# Patient Record
Sex: Female | Born: 1950 | Race: White | Hispanic: No | Smoking: Never smoker
Health system: Southern US, Community
[De-identification: ages and names within clinical notes are randomized; demographics above are authoritative.]

## PROBLEM LIST (undated history)

## (undated) DIAGNOSIS — R350 Frequency of micturition: Secondary | ICD-10-CM

## (undated) DIAGNOSIS — F419 Anxiety disorder, unspecified: Secondary | ICD-10-CM

## (undated) DIAGNOSIS — E785 Hyperlipidemia, unspecified: Secondary | ICD-10-CM

## (undated) DIAGNOSIS — R7302 Impaired glucose tolerance (oral): Secondary | ICD-10-CM

## (undated) DIAGNOSIS — M4800 Spinal stenosis, site unspecified: Secondary | ICD-10-CM

## (undated) DIAGNOSIS — D649 Anemia, unspecified: Secondary | ICD-10-CM

## (undated) DIAGNOSIS — F32A Depression, unspecified: Secondary | ICD-10-CM

## (undated) DIAGNOSIS — I10 Essential (primary) hypertension: Secondary | ICD-10-CM

## (undated) DIAGNOSIS — N390 Urinary tract infection, site not specified: Secondary | ICD-10-CM

## (undated) DIAGNOSIS — E669 Obesity, unspecified: Secondary | ICD-10-CM

## (undated) DIAGNOSIS — L309 Dermatitis, unspecified: Secondary | ICD-10-CM

## (undated) HISTORY — DX: Obesity, unspecified: E66.9

## (undated) HISTORY — PX: TUBAL LIGATION: SHX77

## (undated) HISTORY — DX: Urinary tract infection, site not specified: N39.0

## (undated) HISTORY — DX: Frequency of micturition: R35.0

## (undated) HISTORY — DX: Hyperlipidemia, unspecified: E78.5

## (undated) HISTORY — DX: Spinal stenosis, site unspecified: M48.00

## (undated) HISTORY — DX: Impaired glucose tolerance (oral): R73.02

## (undated) HISTORY — DX: Dermatitis, unspecified: L30.9

## (undated) HISTORY — DX: Essential (primary) hypertension: I10

---

## 1999-01-25 ENCOUNTER — Other Ambulatory Visit: Admission: RE | Admit: 1999-01-25 | Discharge: 1999-01-25 | Payer: Self-pay | Admitting: Family Medicine

## 2001-02-06 ENCOUNTER — Ambulatory Visit (HOSPITAL_COMMUNITY): Admission: RE | Admit: 2001-02-06 | Discharge: 2001-02-06 | Payer: Self-pay | Admitting: *Deleted

## 2001-02-06 ENCOUNTER — Encounter: Payer: Self-pay | Admitting: *Deleted

## 2001-02-12 ENCOUNTER — Other Ambulatory Visit: Admission: RE | Admit: 2001-02-12 | Discharge: 2001-02-12 | Payer: Self-pay | Admitting: *Deleted

## 2001-06-04 HISTORY — PX: OTHER SURGICAL HISTORY: SHX169

## 2002-03-12 ENCOUNTER — Ambulatory Visit (HOSPITAL_COMMUNITY): Admission: RE | Admit: 2002-03-12 | Discharge: 2002-03-12 | Payer: Self-pay | Admitting: *Deleted

## 2002-03-12 ENCOUNTER — Encounter: Payer: Self-pay | Admitting: *Deleted

## 2003-04-08 ENCOUNTER — Ambulatory Visit (HOSPITAL_COMMUNITY): Admission: RE | Admit: 2003-04-08 | Discharge: 2003-04-08 | Payer: Self-pay | Admitting: *Deleted

## 2004-05-12 ENCOUNTER — Ambulatory Visit: Payer: Self-pay | Admitting: Family Medicine

## 2004-07-14 ENCOUNTER — Ambulatory Visit: Payer: Self-pay | Admitting: Family Medicine

## 2004-08-29 ENCOUNTER — Ambulatory Visit: Payer: Self-pay | Admitting: Family Medicine

## 2004-09-12 ENCOUNTER — Ambulatory Visit (HOSPITAL_COMMUNITY): Admission: RE | Admit: 2004-09-12 | Discharge: 2004-09-12 | Payer: Self-pay | Admitting: Family Medicine

## 2004-10-31 ENCOUNTER — Ambulatory Visit: Payer: Self-pay | Admitting: Family Medicine

## 2005-02-23 ENCOUNTER — Ambulatory Visit: Payer: Self-pay | Admitting: Family Medicine

## 2005-03-27 ENCOUNTER — Ambulatory Visit (HOSPITAL_COMMUNITY): Admission: RE | Admit: 2005-03-27 | Discharge: 2005-03-27 | Payer: Self-pay | Admitting: General Surgery

## 2005-06-19 ENCOUNTER — Ambulatory Visit: Payer: Self-pay | Admitting: Family Medicine

## 2005-09-11 ENCOUNTER — Ambulatory Visit: Payer: Self-pay | Admitting: Family Medicine

## 2005-11-08 ENCOUNTER — Ambulatory Visit (HOSPITAL_COMMUNITY): Admission: RE | Admit: 2005-11-08 | Discharge: 2005-11-08 | Payer: Self-pay | Admitting: Family Medicine

## 2005-11-13 ENCOUNTER — Other Ambulatory Visit: Admission: RE | Admit: 2005-11-13 | Discharge: 2005-11-13 | Payer: Self-pay | Admitting: Family Medicine

## 2005-11-13 ENCOUNTER — Ambulatory Visit: Payer: Self-pay | Admitting: Family Medicine

## 2005-11-13 ENCOUNTER — Encounter: Payer: Self-pay | Admitting: Family Medicine

## 2005-11-21 ENCOUNTER — Ambulatory Visit (HOSPITAL_COMMUNITY): Admission: RE | Admit: 2005-11-21 | Discharge: 2005-11-21 | Payer: Self-pay | Admitting: Family Medicine

## 2006-01-15 ENCOUNTER — Ambulatory Visit: Payer: Self-pay | Admitting: Family Medicine

## 2006-07-29 ENCOUNTER — Ambulatory Visit: Payer: Self-pay | Admitting: Family Medicine

## 2006-08-08 ENCOUNTER — Encounter: Payer: Self-pay | Admitting: Family Medicine

## 2006-08-08 LAB — CONVERTED CEMR LAB
ALT: 21 units/L (ref 0–35)
AST: 17 units/L (ref 0–37)
Alkaline Phosphatase: 102 units/L (ref 39–117)
Bilirubin, Direct: 0.1 mg/dL (ref 0.0–0.3)
CO2: 22 meq/L (ref 19–32)
Calcium: 9.5 mg/dL (ref 8.4–10.5)
Chloride: 104 meq/L (ref 96–112)
Cholesterol: 200 mg/dL (ref 0–200)
Creatinine, Ser: 0.94 mg/dL (ref 0.40–1.20)
Glucose, Bld: 107 mg/dL — ABNORMAL HIGH (ref 70–99)
HDL: 38 mg/dL — ABNORMAL LOW (ref >39)
Indirect Bilirubin: 0.5 mg/dL (ref 0.0–0.9)
Total Bilirubin: 0.6 mg/dL (ref 0.3–1.2)
Total Protein: 7.4 g/dL (ref 6.0–8.3)
Triglycerides: 118 mg/dL (ref <150)

## 2006-12-03 ENCOUNTER — Encounter: Payer: Self-pay | Admitting: Family Medicine

## 2006-12-03 ENCOUNTER — Other Ambulatory Visit: Admission: RE | Admit: 2006-12-03 | Discharge: 2006-12-03 | Payer: Self-pay | Admitting: Family Medicine

## 2006-12-03 ENCOUNTER — Ambulatory Visit: Payer: Self-pay | Admitting: Family Medicine

## 2007-02-13 ENCOUNTER — Encounter: Payer: Self-pay | Admitting: Obstetrics and Gynecology

## 2007-02-13 ENCOUNTER — Ambulatory Visit (HOSPITAL_COMMUNITY): Admission: RE | Admit: 2007-02-13 | Discharge: 2007-02-13 | Payer: Self-pay | Admitting: Obstetrics and Gynecology

## 2007-06-05 ENCOUNTER — Encounter: Payer: Self-pay | Admitting: Family Medicine

## 2007-11-18 ENCOUNTER — Ambulatory Visit: Payer: Self-pay | Admitting: Family Medicine

## 2007-11-26 ENCOUNTER — Ambulatory Visit (HOSPITAL_COMMUNITY): Admission: RE | Admit: 2007-11-26 | Discharge: 2007-11-26 | Payer: Self-pay | Admitting: Family Medicine

## 2007-11-27 ENCOUNTER — Encounter: Payer: Self-pay | Admitting: Family Medicine

## 2007-11-27 DIAGNOSIS — E785 Hyperlipidemia, unspecified: Secondary | ICD-10-CM | POA: Insufficient documentation

## 2007-11-27 DIAGNOSIS — L259 Unspecified contact dermatitis, unspecified cause: Secondary | ICD-10-CM | POA: Insufficient documentation

## 2007-11-27 DIAGNOSIS — I10 Essential (primary) hypertension: Secondary | ICD-10-CM | POA: Insufficient documentation

## 2007-11-27 DIAGNOSIS — E669 Obesity, unspecified: Secondary | ICD-10-CM | POA: Insufficient documentation

## 2007-11-29 ENCOUNTER — Emergency Department (HOSPITAL_COMMUNITY): Admission: EM | Admit: 2007-11-29 | Discharge: 2007-11-29 | Payer: Self-pay | Admitting: Emergency Medicine

## 2007-12-16 ENCOUNTER — Encounter: Payer: Self-pay | Admitting: Family Medicine

## 2007-12-29 ENCOUNTER — Other Ambulatory Visit: Admission: RE | Admit: 2007-12-29 | Discharge: 2007-12-29 | Payer: Self-pay | Admitting: Family Medicine

## 2007-12-29 ENCOUNTER — Ambulatory Visit: Payer: Self-pay | Admitting: Family Medicine

## 2007-12-29 ENCOUNTER — Encounter: Payer: Self-pay | Admitting: Family Medicine

## 2007-12-29 LAB — CONVERTED CEMR LAB: Pap Smear: NORMAL

## 2008-01-12 ENCOUNTER — Telehealth: Payer: Self-pay | Admitting: Family Medicine

## 2008-01-13 ENCOUNTER — Ambulatory Visit: Payer: Self-pay | Admitting: Family Medicine

## 2008-01-13 LAB — CONVERTED CEMR LAB
Glucose, Urine, Semiquant: NEGATIVE
Ketones, urine, test strip: NEGATIVE
Specific Gravity, Urine: 1.02
Urobilinogen, UA: 1
pH: 5

## 2008-01-15 ENCOUNTER — Encounter: Payer: Self-pay | Admitting: Family Medicine

## 2008-02-03 ENCOUNTER — Telehealth: Payer: Self-pay | Admitting: Family Medicine

## 2008-05-06 ENCOUNTER — Encounter: Payer: Self-pay | Admitting: Family Medicine

## 2008-07-19 ENCOUNTER — Ambulatory Visit: Payer: Self-pay | Admitting: Family Medicine

## 2008-07-19 LAB — CONVERTED CEMR LAB: Hgb A1c MFr Bld: 6.1 %

## 2008-08-05 ENCOUNTER — Telehealth: Payer: Self-pay | Admitting: Family Medicine

## 2008-08-05 ENCOUNTER — Encounter: Payer: Self-pay | Admitting: Family Medicine

## 2008-12-09 ENCOUNTER — Ambulatory Visit: Payer: Self-pay | Admitting: Family Medicine

## 2008-12-09 DIAGNOSIS — I83893 Varicose veins of bilateral lower extremities with other complications: Secondary | ICD-10-CM | POA: Insufficient documentation

## 2008-12-15 ENCOUNTER — Encounter: Payer: Self-pay | Admitting: Family Medicine

## 2008-12-20 ENCOUNTER — Ambulatory Visit (HOSPITAL_COMMUNITY): Admission: RE | Admit: 2008-12-20 | Discharge: 2008-12-20 | Payer: Self-pay | Admitting: Family Medicine

## 2009-01-31 ENCOUNTER — Ambulatory Visit: Payer: Self-pay | Admitting: Vascular Surgery

## 2009-01-31 ENCOUNTER — Encounter: Payer: Self-pay | Admitting: Family Medicine

## 2009-02-02 ENCOUNTER — Other Ambulatory Visit: Admission: RE | Admit: 2009-02-02 | Discharge: 2009-02-02 | Payer: Self-pay | Admitting: Family Medicine

## 2009-02-02 ENCOUNTER — Encounter: Payer: Self-pay | Admitting: Family Medicine

## 2009-02-02 ENCOUNTER — Ambulatory Visit: Payer: Self-pay | Admitting: Family Medicine

## 2009-02-02 LAB — CONVERTED CEMR LAB: Hgb A1c MFr Bld: 6.1 %

## 2009-02-24 ENCOUNTER — Telehealth: Payer: Self-pay | Admitting: Family Medicine

## 2009-02-25 ENCOUNTER — Ambulatory Visit: Payer: Self-pay | Admitting: Family Medicine

## 2009-02-25 ENCOUNTER — Telehealth: Payer: Self-pay | Admitting: Family Medicine

## 2009-02-25 LAB — CONVERTED CEMR LAB
Glucose, Urine, Semiquant: NEGATIVE
Nitrite: NEGATIVE
WBC Urine, dipstick: NEGATIVE

## 2009-02-26 ENCOUNTER — Encounter: Payer: Self-pay | Admitting: Family Medicine

## 2009-03-10 ENCOUNTER — Telehealth: Payer: Self-pay | Admitting: Family Medicine

## 2009-03-10 DIAGNOSIS — R5381 Other malaise: Secondary | ICD-10-CM | POA: Insufficient documentation

## 2009-03-10 DIAGNOSIS — R5383 Other fatigue: Secondary | ICD-10-CM

## 2009-03-21 ENCOUNTER — Encounter: Payer: Self-pay | Admitting: Family Medicine

## 2009-04-14 ENCOUNTER — Ambulatory Visit: Payer: Self-pay | Admitting: Family Medicine

## 2009-07-07 ENCOUNTER — Ambulatory Visit: Payer: Self-pay | Admitting: Family Medicine

## 2009-08-05 ENCOUNTER — Telehealth: Payer: Self-pay | Admitting: Family Medicine

## 2009-08-10 ENCOUNTER — Encounter: Payer: Self-pay | Admitting: Family Medicine

## 2009-08-16 ENCOUNTER — Ambulatory Visit: Payer: Self-pay | Admitting: Family Medicine

## 2009-08-16 DIAGNOSIS — R7303 Prediabetes: Secondary | ICD-10-CM | POA: Insufficient documentation

## 2009-10-17 ENCOUNTER — Ambulatory Visit: Payer: Self-pay | Admitting: Family Medicine

## 2009-10-19 ENCOUNTER — Telehealth: Payer: Self-pay | Admitting: Physician Assistant

## 2010-01-12 ENCOUNTER — Encounter: Payer: Self-pay | Admitting: Family Medicine

## 2010-01-25 ENCOUNTER — Encounter: Payer: Self-pay | Admitting: Family Medicine

## 2010-03-21 ENCOUNTER — Telehealth: Payer: Self-pay | Admitting: Family Medicine

## 2010-04-05 ENCOUNTER — Other Ambulatory Visit: Admission: RE | Admit: 2010-04-05 | Discharge: 2010-04-05 | Payer: Self-pay | Admitting: Family Medicine

## 2010-04-05 ENCOUNTER — Ambulatory Visit: Payer: Self-pay | Admitting: Family Medicine

## 2010-04-05 LAB — HM PAP SMEAR

## 2010-04-05 LAB — HM DIABETES FOOT EXAM

## 2010-04-05 LAB — CONVERTED CEMR LAB: OCCULT 1: NEGATIVE

## 2010-04-10 ENCOUNTER — Encounter: Payer: Self-pay | Admitting: Family Medicine

## 2010-04-10 LAB — CONVERTED CEMR LAB: Pap Smear: NEGATIVE

## 2010-04-11 ENCOUNTER — Ambulatory Visit (HOSPITAL_COMMUNITY): Admission: RE | Admit: 2010-04-11 | Discharge: 2010-04-11 | Payer: Self-pay | Admitting: Family Medicine

## 2010-04-18 ENCOUNTER — Encounter: Payer: Self-pay | Admitting: Family Medicine

## 2010-06-25 ENCOUNTER — Encounter: Payer: Self-pay | Admitting: Family Medicine

## 2010-06-26 ENCOUNTER — Encounter: Payer: Self-pay | Admitting: Family Medicine

## 2010-07-06 NOTE — Letter (Signed)
Summary: MEDICAL RELEASE  MEDICAL RELEASE   Imported By: Lind Guest 04/05/2010 17:19:22  _____________________________________________________________________  External Attachment:    Type:   Image     Comment:   External Document

## 2010-07-06 NOTE — Letter (Signed)
Summary: medication management summary / [employer]  medication management summary / [employer]   Imported By: Lind Guest 04/18/2010 14:32:43  _____________________________________________________________________  External Attachment:    Type:   Image     Comment:   External Document

## 2010-07-06 NOTE — Assessment & Plan Note (Signed)
Summary: office visit   Vital Signs:  Patient profile:   60 year old female Menstrual status:  postmenopausal Height:      65 inches Weight:      194 pounds BMI:     32.40 O2 Sat:      98 % Pulse rate:   91 / minute Pulse rhythm:   regular Resp:     16 per minute BP sitting:   150 / 92  Vitals Entered By: Everitt Amber (July 07, 2009 9:24 AM)  Nutrition Counseling: Patient's BMI is greater than 25 and therefore counseled on weight management options. CC: hurting in her shoulders and all through her neck, throbbing like a toothache and she can't sleep, neck hurts when she turns it. Been off and on for a few weeks   CC:  hurting in her shoulders and all through her neck, throbbing like a toothache and she can't sleep, and neck hurts when she turns it. Been off and on for a few weeks.  History of Present Illness: Reports  that she has been doing well, except for neck spasm and muscle tightness, which has worsened over the past month. The pt has been very involved in moving over the past 6 to 8 months , and she believes that this is a culmination of the entire process. Turning her head is even painful. Denies recent fever or chills. Denies sinus pressure, nasal congestion , ear pain or sore throat. Denies chest congestion, or cough productive of sputum. Denies chest pain, palpitations, PND, orthopnea or leg swelling. Denies abdominal pain, nausea, vomitting, diarrhea or constipation. Denies change in bowel movements or bloody stool. Denies dysuria , frequency, incontinence or hesitancy.  Denies headaches, vertigo, seizures. Denies depression, anxiety or insomnia. Denies  rash, lesions, or itch.     Current Medications (verified): 1)  Atenolol 25 Mg  Tabs (Atenolol) .... One Tab By Mouth Once Daily 2)  Lisinopril-Hydrochlorothiazide 20-25 Mg  Tabs (Lisinopril-Hydrochlorothiazide) .... One Tab By Mouth Once Daily 3)  Citalopram Hydrobromide 10 Mg Tabs (Citalopram Hydrobromide)  .... Take 1 Tablet By Mouth Once A Day 4)  Trilipix 135 Mg Cpdr (Choline Fenofibrate) .... Take 1 Tablet By Mouth Once A Day  Allergies (verified): No Known Drug Allergies  Review of Systems MS:  Complains of stiffness; neck pain and spa several months, moving her homenew . Heme:  Denies abnormal bruising and bleeding. Allergy:  Denies hives or rash and sneezing.  Physical Exam  General:  Well-developed,well-nourished,in no acute distress; alert,appropriate and cooperative throughout examination HEENT: No facial asymmetry,  EOMI, No sinus tenderness, TM's Clear, oropharynx  pink and moist. decreased ROM cervical spine with bilateral trapezius spsm  Chest: Clear to auscultation bilaterally.  CVS: S1, S2, No murmurs, No S3.   Abd: Soft, Nontender.  MS: Adequate ROM spine, hips, shoulders and knees.  Ext: No edema.   CNS: CN 2-12 intact, power tone and sensation normal throughout.   Skin: Intact, erythematous macular rash on lower extremities  Psych: Good eye contact, normal affect.  Memory intact, not anxious or depressed appearing.    Impression & Recommendations:  Problem # 1:  NECK PAIN (ICD-723.1) Assessment Comment Only  Her updated medication list for this problem includes:    Cyclobenzaprine Hcl 10 Mg Tabs (Cyclobenzaprine hcl) .Marland Kitchen... Take 1 tab by mouth at bedtime  Orders: Depo- Medrol 80mg  (J1040) Ketorolac-Toradol 15mg  (U0454) Admin of Therapeutic Inj  intramuscular or subcutaneous (09811)  Problem # 2:  SHOULDER PAIN, LEFT (ICD-719.41)  Assessment: Deteriorated  Her updated medication list for this problem includes:    Cyclobenzaprine Hcl 10 Mg Tabs (Cyclobenzaprine hcl) .Marland Kitchen... Take 1 tab by mouth at bedtime  Problem # 3:  HYPERTENSION (ICD-401.9) Assessment: Deteriorated  Her updated medication list for this problem includes:    Atenolol 25 Mg Tabs (Atenolol) ..... One tab by mouth once daily    Lisinopril-hydrochlorothiazide 20-25 Mg Tabs  (Lisinopril-hydrochlorothiazide) ..... One tab by mouth once daily  BP today: 150/92 Prior BP: 123/75 (04/14/2009)  Labs Reviewed: K+: 4.5 (08/08/2006) Creat: : 0.94 (08/08/2006)   Chol: 200 (08/08/2006)   HDL: 38 (08/08/2006)   LDL: 138 (08/08/2006)   TG: 118 (08/08/2006)  Problem # 4:  HYPERLIPIDEMIA (ICD-272.4) Assessment: Comment Only  The following medications were removed from the medication list:    Trilipix 135 Mg Cpdr (Choline fenofibrate) ..... One tab by mouth qd Her updated medication list for this problem includes:    Trilipix 135 Mg Cpdr (Choline fenofibrate) .Marland Kitchen... Take 1 tablet by mouth once a day  Labs Reviewed: SGOT: 17 (08/08/2006)   SGPT: 21 (08/08/2006)   HDL:38 (08/08/2006)  LDL:138 (08/08/2006)  Chol:200 (08/08/2006)  Trig:118 (08/08/2006), nmore recent labs done on the job  Problem # 5:  OBESITY (ICD-278.00) Assessment: Unchanged  Ht: 65 (07/07/2009)   Wt: 194 (07/07/2009)   BMI: 32.40 (07/07/2009)  Complete Medication List: 1)  Atenolol 25 Mg Tabs (Atenolol) .... One tab by mouth once daily 2)  Lisinopril-hydrochlorothiazide 20-25 Mg Tabs (Lisinopril-hydrochlorothiazide) .... One tab by mouth once daily 3)  Citalopram Hydrobromide 10 Mg Tabs (Citalopram hydrobromide) .... Take 1 tablet by mouth once a day 4)  Trilipix 135 Mg Cpdr (Choline fenofibrate) .... Take 1 tablet by mouth once a day 5)  Cyclobenzaprine Hcl 10 Mg Tabs (Cyclobenzaprine hcl) .... Take 1 tab by mouth at bedtime 6)  Voltaren 1 % Gel (Diclofenac sodium) .... Apply 3 times daily to affected area  Patient Instructions: 1)  f/u as before. 2)    3)  It is important that you exercise regularly at least 20 minutes 5 times a week. If you develop chest pain, have severe difficulty breathing, or feel very tired , stop exercising immediately and seek medical attention. 4)  You need to lose weight. Consider a lower calorie diet and regular exercise.  5)  You will get injections in the offie today  for the neck pain and spasm  and meds are sent in also. Prescriptions: VOLTAREN 1 % GEL (DICLOFENAC SODIUM) apply 3 times daily to affected area  #30gm x 1   Entered and Authorized by:   Syliva Overman MD   Signed by:   Syliva Overman MD on 07/07/2009   Method used:   Electronically to        [facility] (retail)       [address]       [place], [place]  [address]       Ph: [phone]       Fax: [phone]   RxID:   [id number] CYCLOBENZAPRINE HCL 10 MG TABS (CYCLOBENZAPRINE HCL) Take 1 tab by mouth at bedtime  #30 x 0   Entered and Authorized by:   Syliva Overman MD   Signed by:   Syliva Overman MD on 07/07/2009   Method used:   Electronically to        [facility] (retail)       [phone] Shelbie Hutching  [address]       [place], [place]  [address]       Ph: [phone]       Fax: [phone]   RxID:   [id number] IBUPROFEN 800 MG TABS (IBUPROFEN) Take 1 tablet by mouth three times a day  #30 x 0   Entered and Authorized by:   Syliva Overman MD   Signed by:   Syliva Overman MD on 07/07/2009   Method used:   Electronically to        [facility] (retail)       [address]       [place], [place]  [address]       Ph: [phone]       Fax: [phone]   RxID:   [id number] PREDNISONE (PAK) 5 MG TABS (PREDNISONE) Use as directed  #21 x 0   Entered and Authorized by:   Syliva Overman MD   Signed by:   Syliva Overman MD on 07/07/2009   Method used:   Electronically to        [facility] (retail)       [address]       [place], [place]  [address]       Ph: [phone]       Fax: [phone]   RxID:   [id number]    Medication Administration  Injection # 1:    Medication: Depo- Medrol 80mg     Diagnosis: NECK PAIN (ICD-723.1)    Route: IM    Site: RUOQ gluteus    Exp Date: 03/2010    Lot #: [id number]    Mfr: [employer]     Comments: 80mg  given     Patient tolerated injection without complications    Given by: Everitt Amber (July 07, 2009 10:21 AM)  Injection # 2:    Medication: Ketorolac-Toradol 15mg     Diagnosis: NECK PAIN (ICD-723.1)    Route: IM    Site: LUOQ gluteus    Exp Date: 02/2011    Lot #: [id number]    Mfr: hospira  Orders Added: 1)  Est. Patient Level IV [09381] 2)  Depo- Medrol 80mg  [J1040] 3)  Ketorolac-Toradol 15mg  [J1885] 4)  Admin of Therapeutic Inj  intramuscular or subcutaneous [82993]

## 2010-07-06 NOTE — Progress Notes (Signed)
Summary: lab work  Phone Note Call from Patient   Summary of Call: has appt. 3.15.11  does she need lab work done or what call her back at [phone] ext. [phone] if no answer leave message Initial call taken by: Lind Guest,  August 05, 2009 9:32 AM  Follow-up for Phone Call        labs printed and faxed to her. Patient aware Follow-up by: Everitt Amber LPN,  August 05, 2009 9:53 AM

## 2010-07-06 NOTE — Letter (Signed)
Summary: misc.  misc.   Imported By: Curtis Sites 10/21/2009 11:11:27  _____________________________________________________________________  External Attachment:    Type:   Image     Comment:   External Document

## 2010-07-06 NOTE — Letter (Signed)
Summary: progress notes  progress notes   Imported By: Curtis Sites 10/21/2009 10:59:53  _____________________________________________________________________  External Attachment:    Type:   Image     Comment:   External Document

## 2010-07-06 NOTE — Letter (Signed)
Summary: consults  consults   Imported By: Curtis Sites 10/21/2009 11:09:52  _____________________________________________________________________  External Attachment:    Type:   Image     Comment:   External Document

## 2010-07-06 NOTE — Assessment & Plan Note (Signed)
Summary: FOLLOW UP   Vital Signs:  Patient profile:   60 year old female Menstrual status:  postmenopausal Height:      65 inches Weight:      192.50 pounds BMI:     32.15 O2 Sat:      98 % Pulse rate:   66 / minute Pulse rhythm:   regular Resp:     16 per minute BP sitting:   130 / 80  (left arm) Cuff size:   large  Vitals Entered By: Everitt Amber LPN (August 16, 2009 8:16 AM)  Nutrition Counseling: Patient's BMI is greater than 25 and therefore counseled on weight management options. CC: Follow up chronic problems   Primary Care Provider:  Syliva Overman MD  CC:  Follow up chronic problems.  History of Present Illness: pt reports improvement in her right shoulderas far asa pain and reducedmobility are concerned , about 90% better. she is here to review recent labs which show uncontrolled hyperlipidemia as well as a new dx of diabetes. Her mother and 2 siblings are diabetic.She is currently asymptomatic, it is true that hse recently had steroids for the shoulder which may have tipped her, i diod stayte this and encouraged her strongly to embrace lifestyle changes which may actually negat the need for meds over time.  Denies recent fever or chills. Denies sinus pressure, nasal congestion , ear pain or sore throat. Denies chest congestion, or cough productive of sputum. Denies chest pain, palpitations, PND, orthopnea or leg swelling. Denies abdominal pain, nausea, vomitting, diarrhea or constipation. Denies change in bowel movements or bloody stool. Denies dysuria , frequency, incontinence or hesitancy. Denies  joint pain, swelling, or reduced mobility. Denies headaches, vertigo, seizures. Denies depression, anxiety or insomnia. Reports improvement in rash on legs with the use of monistat   Current Medications (verified): 1)  Atenolol 25 Mg  Tabs (Atenolol) .... One Tab By Mouth Once Daily 2)  Lisinopril-Hydrochlorothiazide 20-25 Mg  Tabs (Lisinopril-Hydrochlorothiazide)  .... One Tab By Mouth Once Daily 3)  Citalopram Hydrobromide 10 Mg Tabs (Citalopram Hydrobromide) .... Take 1 Tablet By Mouth Once A Day 4)  Trilipix 135 Mg Cpdr (Choline Fenofibrate) .... Take 1 Tablet By Mouth Once A Day 5)  Cyclobenzaprine Hcl 10 Mg Tabs (Cyclobenzaprine Hcl) .... Take 1 Tab By Mouth At Bedtime 6)  Voltaren 1 % Gel (Diclofenac Sodium) .... Apply 3 Times Daily To Affected Area  Allergies (verified): No Known Drug Allergies  Past History:  Past medical, surgical, family and social histories (including risk factors) reviewed, and no changes noted (except as noted below).  Past Medical History: Reviewed history from 11/27/2007 and no changes required. Current Problems:  IMPAIRED GLUCOSE TOLERANCE (ICD-271.3) DERMATITIS (ICD-692.9) OBESITY (ICD-278.00) HYPERLIPIDEMIA (ICD-272.4) HYPERTENSION (ICD-401.9)  Past Surgical History: Reviewed history from 11/27/2007 and no changes required. Right breast bx. calcification benign (2003)  Family History: Reviewed history from 11/27/2007 and no changes required. Mother living - Addison dz, diabetic Father deceased 32 - stroke and heart attack Two living sisters - x1 irregular heart rate, x1 hx of bacterial , diabetesx 2endocarditis or rheumatic fever Two living brothers - x1 HTN, CAD Twin brothers died at birth  Social History: Reviewed history from 11/27/2007 and no changes required. Employed widow One son Never Smoked Alcohol use-no Drug use-no  Review of Systems      See HPI Eyes:  Denies blurring, discharge, eye pain, and red eye; has had eye exam in the past 6 months, reportedly normal. MS:  Complains  of joint pain and stiffness; impropved right shoulder pain and mobility. Endo:  Denies cold intolerance, excessive hunger, excessive thirst, excessive urination, heat intolerance, polyuria, and weight change. Heme:  Denies abnormal bruising and bleeding. Allergy:  Denies hives or rash and itching  eyes.  Physical Exam  General:  Well-developed,well-nourished,in no acute distress; alert,appropriate and cooperative throughout examination HEENT: No facial asymmetry,  EOMI, No sinus tenderness, TM's Clear, oropharynx  pink and moist.   Chest: Clear to auscultation bilaterally.  CVS: S1, S2, No murmurs, No S3.   Abd: Soft, Nontender.  MS: Adequate ROM spine, hips, shoulders and knees.  Ext: No edema.   CNS: CN 2-12 intact, power tone and sensation normal throughout.   Skin: Intact, no visible lesions or rashes.  Psych: Good eye contact, normal affect.  Memory intact, not anxious or depressed appearing.   Diabetes Management Exam:    Foot Exam (with socks and/or shoes not present):       Sensory-Monofilament:          Left foot: normal          Right foot: normal       Inspection:          Left foot: normal          Right foot: normal       Nails:          Left foot: normal          Right foot: normal   Impression & Recommendations:  Problem # 1:  DIABETES MELLITUS (ICD-250.00) Assessment Comment Only  Her updated medication list for this problem includes:    Lisinopril-hydrochlorothiazide 20-25 Mg Tabs (Lisinopril-hydrochlorothiazide) ..... One tab by mouth once daily    Metformin Hcl 500 Mg Tabs (Metformin hcl) .Marland Kitchen... Take 1 tablet by mouth two times a day  Orders: T- Hemoglobin A1C ([id number])  6.5 08/2009, diabetic ed done by mD and nurse in the office and community resources provided also  Labs Reviewed: Creat: 0.94 (08/08/2006)    Reviewed HgBA1c results: 6.1 (02/02/2009)  6.1 (07/19/2008)  Problem # 2:  SHOULDER PAIN, LEFT (ICD-719.41) Assessment: Improved  Her updated medication list for this problem includes:    Cyclobenzaprine Hcl 10 Mg Tabs (Cyclobenzaprine hcl) .Marland Kitchen... Take 1 tab by mouth at bedtime  Problem # 3:  NECK PAIN (ICD-723.1) Assessment: Improved  Her updated medication list for this problem includes:    Cyclobenzaprine Hcl 10 Mg Tabs  (Cyclobenzaprine hcl) .Marland Kitchen... Take 1 tab by mouth at bedtime  Problem # 4:  HYPERLIPIDEMIA (ICD-272.4) Assessment: Deteriorated  The following medications were removed from the medication list:    Trilipix 135 Mg Cpdr (Choline fenofibrate) .Marland Kitchen... Take 1 tablet by mouth once a day Her updated medication list for this problem includes:    Lovastatin 20 Mg Tabs (Lovastatin) .Marland Kitchen... Take 1 tab by mouth at bedtime  Orders: T-Hepatic Function [id number]), 2011 labs reviewed lDL elevated, pt to resume lovastatin 20mg  and follow low fat diet which was discussed T-Lipid Profile [phone])  Labs Reviewed: SGOT: 17 (08/08/2006)   SGPT: 21 (08/08/2006)   HDL:38 (08/08/2006)  LDL:138 (08/08/2006)  Chol:200 (08/08/2006)  Trig:118 (08/08/2006)  Problem # 5:  HYPERTENSION (ICD-401.9) Assessment: Improved  Her updated medication list for this problem includes:    Atenolol 25 Mg Tabs (Atenolol) ..... One tab by mouth once daily    Lisinopril-hydrochlorothiazide 20-25 Mg Tabs (Lisinopril-hydrochlorothiazide) ..... One tab by mouth once daily  Orders: T-Basic Metabolic Panel [id number])  BP today: 130/80  Prior BP: 150/92 (07/07/2009)  Labs Reviewed: K+: 4.5 (08/08/2006) Creat: : 0.94 (08/08/2006)   Chol: 200 (08/08/2006)   HDL: 38 (08/08/2006)   LDL: 138 (08/08/2006)   TG: 118 (08/08/2006)  Complete Medication List: 1)  Atenolol 25 Mg Tabs (Atenolol) .... One tab by mouth once daily 2)  Lisinopril-hydrochlorothiazide 20-25 Mg Tabs (Lisinopril-hydrochlorothiazide) .... One tab by mouth once daily 3)  Citalopram Hydrobromide 10 Mg Tabs (Citalopram hydrobromide) .... Take 1 tablet by mouth once a day 4)  Cyclobenzaprine Hcl 10 Mg Tabs (Cyclobenzaprine hcl) .... Take 1 tab by mouth at bedtime 5)  Voltaren 1 % Gel (Diclofenac sodium) .... Apply 3 times daily to affected area 6)  Metformin Hcl 500 Mg Tabs (Metformin hcl) .... Take 1 tablet by mouth two times a day 7)  Lovastatin 20 Mg Tabs  (Lovastatin) .... Take 1 tab by mouth at bedtime  Patient Instructions: 1)  Please schedule a follow-up appointment in 3 months. 2)  It is important that you exercise regularly at least 30 minutes 6 times a week. If you develop chest pain, have severe difficulty breathing, or feel very tired , stop exercising immediately and seek medical attention. 3)  You need to lose weight. Consider a lower calorie diet and regular exercise.  4)  Check your blood sugars regularly. If your readings are usually above 180 or below 70 you should contact our office. 5)  Pls start metformin 500mg  one daily, after 2 to 3 weeks  if blood sugars are still out of range inc to twice daily, call with questions. 6)  Pls drink at least 42 ounces of water daily. 7)  Pls speak to a diabetic educator asap. 8)  Pls tes once daily and record, ok to test at different times of the day. 9)  Pls resume lovastatin one at night and follow a low fat diet your bad cholest is high. 10)  BMP prior to visit, ICD-9: 11)  Hepatic Panel prior to visit, ICD-9:  fasting in 3 months 12)  Lipid Panel prior to visit, ICD-9: 13)  HbgA1C prior to visit, ICD-9: Prescriptions: LOVASTATIN 20 MG TABS (LOVASTATIN) Take 1 tab by mouth at bedtime  #90 x 0   Entered and Authorized by:   Syliva Overman MD   Signed by:   Syliva Overman MD on 08/16/2009   Method used:   Electronically to        [facility] (retail)       [address]       [place], [place]  [address]       Ph: [phone]       Fax: [phone]   RxID:   [id number] METFORMIN HCL 500 MG TABS (METFORMIN HCL) Take 1 tablet by mouth two times a day  #180 x 0   Entered and Authorized by:   Syliva Overman MD   Signed by:   Syliva Overman MD on 08/16/2009   Method used:   Electronically to        [facility] (retail)       [address]       [place], [place]  [address]       Ph: [phone]       Fax:  [phone]   RxID:   [id number]

## 2010-07-06 NOTE — Letter (Signed)
Summary: xray  xray   Imported By: Curtis Sites 10/21/2009 11:10:37  _____________________________________________________________________  External Attachment:    Type:   Image     Comment:   External Document

## 2010-07-06 NOTE — Medication Information (Signed)
Summary: Tax adviser   Imported By: Lind Guest 04/05/2010 17:19:01  _____________________________________________________________________  External Attachment:    Type:   Image     Comment:   External Document

## 2010-07-06 NOTE — Letter (Signed)
Summary: Pap Smear, Normal Letter, [facility]  [address]   [place], [place] [address]   Phone: [phone]  Fax: [phone]          April 10, 2010    Dear: [name]    I am pleased to notify you that your PAP smear was normal.  You will need your next PAP smear in:     ____ 3 Months    ____ 6 Months    __*__12 Months    Please call the office at our office number above, to schedule your next appointment.    Sincerely,    Marijean Niemann B. Boothe, LPN [facility]

## 2010-07-06 NOTE — Letter (Signed)
Summary: labs  labs   Imported By: Curtis Sites 10/21/2009 10:59:23  _____________________________________________________________________  External Attachment:    Type:   Image     Comment:   External Document

## 2010-07-06 NOTE — Assessment & Plan Note (Signed)
Summary: sick- room 1   Vital Signs:  Patient profile:   60 year old female Menstrual status:  postmenopausal Height:      65 inches Weight:      190.75 pounds BMI:     31.86 O2 Sat:      96 % on Room air Pulse rate:   88 / minute Resp:     16 per minute BP sitting:   126 / 70  (left arm)  Vitals Entered By: Adella Hare LPN (Oct 17, 2009 9:23 AM) CC: cough, congestion, chills Is Patient Diabetic? No Pain Assessment Patient in pain? no      Comments didnt bring meds to ov   Primary Provider:  Syliva Overman MD  CC:  cough, congestion, and chills.  History of Present Illness: Pt is here today with c/o chest congestion & cough.  The cough started approx 5 days ago.  Her chest feels congested but the cough is nonproductive.   She had nasal congestion for a few days before the onset of the cough.  It went away but now has come back again.  Is clear in color.  No fever or chills.  She has tried over the counter cough & cold medicine without much help.  She is also taking a daily allergy med.  Pt is diabetic.  She checks her blood sugars daily, and states "they are in the ranges that Dr Lodema Hong gave me."  She has an appt next mos for her regular f/u.  She has been walking daily for exercise.   Allergies (verified): No Known Drug Allergies  Past History:  Past medical history reviewed for relevance to current acute and chronic problems.  Past Medical History: Reviewed history from 11/27/2007 and no changes required. Current Problems:  IMPAIRED GLUCOSE TOLERANCE (ICD-271.3) DERMATITIS (ICD-692.9) OBESITY (ICD-278.00) HYPERLIPIDEMIA (ICD-272.4) HYPERTENSION (ICD-401.9)  Review of Systems General:  Denies chills and fever. ENT:  Complains of nasal congestion; denies earache, sinus pressure, and sore throat. CV:  Denies chest pain or discomfort. Resp:  Complains of cough; denies shortness of breath and sputum productive. Allergy:  Complains of seasonal allergies;  denies itching eyes and sneezing.  Physical Exam  General:  Well-developed,well-nourished,in no acute distress; alert,appropriate and cooperative throughout examination Head:  Normocephalic and atraumatic without obvious abnormalities. No apparent alopecia or balding. Ears:  External ear exam shows no significant lesions or deformities.  Otoscopic examination reveals clear canals, tympanic membranes are intact bilaterally without bulging, retraction, inflammation or discharge. Hearing is grossly normal bilaterally. Nose:  External nasal examination shows no deformity or inflammation. Nasal mucosa are erythematous and moist without lesions or exudates or swelling. no sinus percussion tenderness.   Mouth:  Oral mucosa and oropharynx without lesions or exudates.   Neck:  No deformities, masses, or tenderness noted. Lungs:  Normal respiratory effort, chest expands symmetrically. coarse BS bilat, no crackles or wheezes. Heart:  Normal rate and regular rhythm. S1 and S2 normal without gallop, murmur, click, rub or other extra sounds. Cervical Nodes:  No lymphadenopathy noted Psych:  Cognition and judgment appear intact. Alert and cooperative with normal attention span and concentration. No apparent delusions, illusions, hallucinations   Impression & Recommendations:  Problem # 1:  ACUTE BRONCHITIS (ICD-466.0) Assessment New  Her updated medication list for this problem includes:    Sulfamethoxazole-tmp Ds 800-160 Mg Tabs (Sulfamethoxazole-trimethoprim) .Marland Kitchen... Take 1 two times a day for 10 days    Benzonatate 100 Mg Caps (Benzonatate) .Marland Kitchen... Take 1-2 caps every 8 hrs  as needed for cough  Problem # 2:  URI (ICD-465.9) Assessment: New  Her updated medication list for this problem includes:    Benzonatate 100 Mg Caps (Benzonatate) .Marland Kitchen... Take 1-2 caps every 8 hrs as needed for cough  Orders: Depo- Medrol 80mg  (J1040) Admin of Therapeutic Inj  intramuscular or subcutaneous ([address])  Problem # 3:   DIABETES MELLITUS (ICD-250.00) Assessment: Comment Only Pt will continue to check her blood sugars and keep her routine appt next mos.  Her updated medication list for this problem includes:    Lisinopril-hydrochlorothiazide 20-25 Mg Tabs (Lisinopril-hydrochlorothiazide) ..... One tab by mouth once daily    Metformin Hcl 500 Mg Tabs (Metformin hcl) .Marland Kitchen... Take 1 tablet by mouth two times a day  Complete Medication List: 1)  Atenolol 25 Mg Tabs (Atenolol) .... One tab by mouth once daily 2)  Lisinopril-hydrochlorothiazide 20-25 Mg Tabs (Lisinopril-hydrochlorothiazide) .... One tab by mouth once daily 3)  Citalopram Hydrobromide 10 Mg Tabs (Citalopram hydrobromide) .... Take 1 tablet by mouth once a day 4)  Cyclobenzaprine Hcl 10 Mg Tabs (Cyclobenzaprine hcl) .... Take 1 tab by mouth at bedtime 5)  Voltaren 1 % Gel (Diclofenac sodium) .... Apply 3 times daily to affected area 6)  Metformin Hcl 500 Mg Tabs (Metformin hcl) .... Take 1 tablet by mouth two times a day 7)  Lovastatin 20 Mg Tabs (Lovastatin) .... Take 1 tab by mouth at bedtime 8)  Sulfamethoxazole-tmp Ds 800-160 Mg Tabs (Sulfamethoxazole-trimethoprim) .... Take 1 two times a day for 10 days 9)  Benzonatate 100 Mg Caps (Benzonatate) .... Take 1-2 caps every 8 hrs as needed for cough  Patient Instructions: 1)  Keep your next appt with Dr Lodema Hong 2)  You have received a shot of Depo Medrol today.  You may notice an increase in your blood sugars. 3)  I have prescribed an antibiotic and cough medicine for you. 4)  Get plenty of rest, drink lots of clear liquids, and use Tylenol or Ibuprofen for fever and comfort. Return in 7-10 days if you're not better:sooner if you're feeling worse. Prescriptions: BENZONATATE 100 MG CAPS (BENZONATATE) take 1-2 caps every 8 hrs as needed for cough  #30 x 0   Entered and Authorized by:   Esperanza Sheets PA   Signed by:   Esperanza Sheets PA on 10/17/2009   Method used:   Electronically to        [facility] (retail)       [address]       [place], [place]  [address]       Ph: [phone]       Fax: [phone]   RxID:   [id number] SULFAMETHOXAZOLE-TMP DS 800-160 MG TABS (SULFAMETHOXAZOLE-TRIMETHOPRIM) take 1 two times a day for 10 days  #20 x 0   Entered and Authorized by:   Esperanza Sheets PA   Signed by:   Esperanza Sheets PA on 10/17/2009   Method used:   Electronically to        [facility] [address] (retail)       [address]       [place], [place]  [address]       Ph: [phone]       Fax: [phone]   RxID:   [id number]    Medication Administration  Injection # 1:    Medication: Depo- Medrol 80mg   Diagnosis: URI (ICD-465.9)    Route: IM    Site: LUOQ gluteus    Exp Date: 12/11    Lot #: [id number]    Mfr: [employer]    Patient tolerated injection without complications    Given by: Adella Hare LPN (Oct 17, 2009 10:20 AM)  Orders Added: 1)  Est. Patient Level IV [81191] 2)  Depo- Medrol 80mg  [J1040] 3)  Admin of Therapeutic Inj  intramuscular or subcutaneous [47829]

## 2010-07-06 NOTE — Letter (Signed)
Summary: Letter  Letter   Imported By: Lind Guest 01/25/2010 11:19:56  _____________________________________________________________________  External Attachment:    Type:   Image     Comment:   External Document

## 2010-07-06 NOTE — Letter (Signed)
Summary: phone notes  phone notes   Imported By: Curtis Sites 10/21/2009 11:10:11  _____________________________________________________________________  External Attachment:    Type:   Image     Comment:   External Document

## 2010-07-06 NOTE — Letter (Signed)
Summary: history and physical  history and physical   Imported By: Curtis Sites 10/21/2009 10:59:08  _____________________________________________________________________  External Attachment:    Type:   Image     Comment:   External Document

## 2010-07-06 NOTE — Progress Notes (Signed)
Summary: antibotics  Phone Note Call from Patient   Summary of Call: pt was in this week and got septra and its making her sick.  Needs to know what to do. [phone]  ext [phone] Initial call taken by: Rudene Anda,  Oct 19, 2009 2:38 PM  Follow-up for Phone Call        Advise pt to discontinue Septra & put this in her allergies pls. ERx ZPak #1 no rf for her. Follow-up by: Esperanza Sheets PA,  Oct 19, 2009 3:37 PM  Additional Follow-up for Phone Call Additional follow up Details #1::        rx sent, patient aware Additional Follow-up by: Adella Hare LPN,  Oct 19, 2009 4:40 PM   New Allergies: ! SULFA New/Updated Medications: ZITHROMAX Z-PAK 250 MG TABS (AZITHROMYCIN) use as directed New Allergies: ! SULFAPrescriptions: ZITHROMAX Z-PAK 250 MG TABS (AZITHROMYCIN) use as directed  #1 x 0   Entered by:   Adella Hare LPN   Authorized by:   Esperanza Sheets PA   Signed by:   Adella Hare LPN on 13/01/6577   Method used:   Electronically to        [facility] [address] (retail)       [address]       [place], [place]  [address]       Ph: [phone]       Fax: [phone]   RxID:   [id number]

## 2010-07-06 NOTE — Letter (Signed)
Summary: demographic  demographic   Imported By: Curtis Sites 10/21/2009 10:58:29  _____________________________________________________________________  External Attachment:    Type:   Image     Comment:   External Document

## 2010-07-06 NOTE — Assessment & Plan Note (Signed)
Summary: PHY   Vital Signs:  Patient profile:   60 year old female Menstrual status:  postmenopausal Height:      65 inches Weight:      194.25 pounds BMI:     32.44 O2 Sat:      93 % on Room air Pulse rate:   79 / minute Pulse rhythm:   regular Resp:     16 per minute BP sitting:   118 / 70  (left arm)  Vitals Entered By: Mauricia Area CMA (April 05, 2010 1:27 PM)  Nutrition Counseling: Patient's BMI is greater than 25 and therefore counseled on weight management options.  O2 Flow:  Room air CC: physical  Vision Screening:Left eye with correction: 20 / 13 Right eye with correction: 20 / 20 Both eyes with correction: 20 / 15        Vision Entered By: Mauricia Area CMA (April 05, 2010 1:41 PM)   Primary Care Provider:  Syliva Overman MD  CC:  physical.  History of Present Illness: Reports  that she has been doing fairly well. She has been under increased stress recently becaUSE OF PERSONAL PROBLEMS THAT HER SON HAS BEEN HAVING HOWEVER. Denies recent fever or chills. Denies sinus pressure, nasal congestion , ear pain or sore throat. Denies chest congestion, or cough productive of sputum. Denies chest pain, palpitations, PND, orthopnea or leg swelling. Denies abdominal pain, nausea, vomitting, diarrhea or constipation. Denies change in bowel movements or bloody stool. Denies dysuria , frequency, incontinence or hesitancy. Denies  joint pain, swelling, or reduced mobility. Denies headaches, vertigo, seizures. Denies depression, anxiety or insomnia.      Current Medications (verified): 1)  Atenolol 25 Mg  Tabs (Atenolol) .... One Tab By Mouth Once Daily 2)  Lisinopril-Hydrochlorothiazide 20-25 Mg  Tabs (Lisinopril-Hydrochlorothiazide) .... One Tab By Mouth Once Daily 3)  Citalopram Hydrobromide 10 Mg Tabs (Citalopram Hydrobromide) .... Take 1 Tablet By Mouth Once A Day 4)  Metformin Hcl 500 Mg Tabs (Metformin Hcl) .... Take 1 Tablet By Mouth Two Times A  Day 5)  Lovastatin 20 Mg Tabs (Lovastatin) .... Take 1 Tab By Mouth At Bedtime 6)  Accu-Chek Compact Test Drum  Strp (Glucose Blood) .... Once Daily Testing Dx:250.00  Allergies (verified): 1)  ! Sulfa  Review of Systems      See HPI General:  Complains of fatigue. Eyes:  eye exam nl in 2011. Derm:  Complains of lesion(s) and rash; red rash on left leg x 3 weeks. Endo:  Denies cold intolerance, excessive hunger, excessive thirst, excessive urination, and heat intolerance. Heme:  Denies abnormal bruising and bleeding. Allergy:  Complains of seasonal allergies; denies hives or rash and itching eyes.  Physical Exam  General:  Well-developed,well-nourished,in no acute distress; alert,appropriate and cooperative throughout examination Head:  Normocephalic and atraumatic without obvious abnormalities. No apparent alopecia or balding. Eyes:  No corneal or conjunctival inflammation noted. EOMI. Perrla. Funduscopic exam benign, without hemorrhages, exudates or papilledema. Vision grossly normal. Ears:  External ear exam shows no significant lesions or deformities.  Otoscopic examination reveals clear canals, tympanic membranes are intact bilaterally without bulging, retraction, inflammation or discharge. Hearing is grossly normal bilaterally. Nose:  External nasal examination shows no deformity or inflammation. Nasal mucosa are pink and moist without lesions or exudates. Mouth:  Oral mucosa and oropharynx without lesions or exudates.  Teeth in good repair. Neck:  No deformities, masses, or tenderness noted. Chest Wall:  No deformities, masses, or tenderness noted. Breasts:  No mass, nodules, thickening, tenderness, bulging, retraction, inflamation, nipple discharge or skin changes noted.   Lungs:  Normal respiratory effort, chest expands symmetrically. Lungs are clear to auscultation, no crackles or wheezes. Heart:  Normal rate and regular rhythm. S1 and S2 normal without gallop, murmur, click,  rub or other extra sounds. Abdomen:  Bowel sounds positive,abdomen soft and non-tender without masses, organomegaly or hernias noted. Rectal:  No external abnormalities noted. Normal sphincter tone. No rectal masses or tenderness. Genitalia:  Normal introitus for age, no external lesions, no vaginal discharge, mucosa pink and moist, no vaginal or cervical lesions, no vaginal atrophy, no friaility or hemorrhage, normal uterus size and position, no adnexal masses or tenderness Msk:  No deformity or scoliosis noted of thoracic or lumbar spine.   Pulses:  R and L carotid,radial,femoral,dorsalis pedis and posterior tibial pulses are full and equal bilaterally Neurologic:  No cranial nerve deficits noted. Station and gait are normal. Plantar reflexes are down-going bilaterally. DTRs are symmetrical throughout. Sensory, motor and coordinative functions appear intact. Skin:  erythematous maculopapular raqsh on anterior aspect of left leg Cervical Nodes:  No lymphadenopathy noted Axillary Nodes:  No palpable lymphadenopathy Inguinal Nodes:  No significant adenopathy Psych:  Cognition and judgment appear intact. Alert and cooperative with normal attention span and concentration. No apparent delusions, illusions, hallucinations  Diabetes Management Exam:    Foot Exam (with socks and/or shoes not present):       Sensory-Monofilament:          Left foot: normal          Right foot: normal       Inspection:          Left foot: normal          Right foot: normal       Nails:          Left foot: normal          Right foot: normal    Foot Exam by Podiatrist:       Results: no diabetic findings    Eye Exam:       Eye Exam done elsewhere          Results: normal          Done by: eye specialist   Impression & Recommendations:  Problem # 1:  DIABETES MELLITUS (ICD-250.00) Assessment Unchanged  Labs Reviewed: Creat: 0.94 (08/08/2006)    Reviewed HgBA1c results: 6.1 (02/02/2009)  6.1  (07/19/2008)  Problem # 2:  OBESITY (ICD-278.00) Assessment: Unchanged  Ht: 65 (04/05/2010)   Wt: 194.25 (04/05/2010)   BMI: 32.44 (04/05/2010) therapeutic lifestyle change discussed and encouraged  Problem # 3:  DERMATITIS (ICD-692.9) Assessment: Deteriorated  Her updated medication list for this problem includes:    Prednisone (pak) 5 Mg Tabs (Prednisone) ..... Use as directed  Problem # 4:  HYPERLIPIDEMIA (ICD-272.4) Assessment: Comment Only  Her updated medication list for this problem includes:    Lovastatin 20 Mg Tabs (Lovastatin) .Marland Kitchen... Take 1 tab by mouth at bedtime Low fat dietdiscussed and encouraged  Orders: T-Hepatic Function [phone]) T-Lipid Profile [phone])  Labs Reviewed: SGOT: 17 (08/08/2006)   SGPT: 21 (08/08/2006)   HDL:38 (08/08/2006)  LDL:138 (08/08/2006)  Chol:200 (08/08/2006)  Trig:118 (08/08/2006)  Problem # 5:  HYPERTENSION (ICD-401.9) Assessment: Unchanged  Her updated medication list for this problem includes:    Atenolol 25 Mg Tabs (Atenolol) ..... One tab by mouth once daily    Lisinopril-hydrochlorothiazide 20-25 Mg Tabs (Lisinopril-hydrochlorothiazide) ..... One  tab by mouth once daily  Orders: T-Basic Metabolic Panel ([id number])  BP today: 118/70 Prior BP: 126/70 (10/17/2009)  Labs Reviewed: K+: 4.5 (08/08/2006) Creat: : 0.94 (08/08/2006)   Chol: 200 (08/08/2006)   HDL: 38 (08/08/2006)   LDL: 138 (08/08/2006)   TG: 118 (08/08/2006)  Complete Medication List: 1)  Atenolol 25 Mg Tabs (Atenolol) .... One tab by mouth once daily 2)  Lisinopril-hydrochlorothiazide 20-25 Mg Tabs (Lisinopril-hydrochlorothiazide) .... One tab by mouth once daily 3)  Citalopram Hydrobromide 10 Mg Tabs (Citalopram hydrobromide) .... Take 1 tablet by mouth once a day 4)  Metformin Hcl 500 Mg Tabs (Metformin hcl) .... Take 1 tablet by mouth two times a day 5)  Lovastatin 20 Mg Tabs (Lovastatin) .... Take 1 tab by mouth at bedtime 6)  Accu-chek Compact  Test Drum Strp (Glucose blood) .... Once daily testing dx:250.00 7)  Prednisone (pak) 5 Mg Tabs (Prednisone) .... Use as directed  Other Orders: T-CBC w/Diff [phone]) T- Hemoglobin A1C ([id number]) T-TSH ([id number]) T-Urine Microalbumin w/creat. ratio ([id number]) Radiology Referral (Radiology)  Patient Instructions: 1)  Please schedule a follow-up appointment in 4 months. 2)  It is important that you exercise regularly at least 20 minutes 5 times a week. If you develop chest pain, have severe difficulty breathing, or feel very tired , stop exercising immediately and seek medical attention. 3)  You need to lose weight. Consider a lower calorie diet and regular exercise.  4)  BMP prior to visit, ICD-9: 5)  Hepatic Panel prior to visit, ICD-9: 6)  Lipid Panel prior to visit, ICD-9: 7)  TSH prior to visit, ICD-9:  fasting  8)  CBC w/ Diff prior to visit, ICD-9: 9)  HbgA1C prior to visit, ICD-9: 10)  Urine Microalbumin prior to visit, ICD-9: 11)  Check your blood sugars regularly. If your readings are usually above 250 or below 70 you should contact our office. 12)  Med iss sent for the rasgh to [facility] Prescriptions: PREDNISONE (PAK) 5 MG TABS (PREDNISONE) Use as directed  #21 x 0   Entered and Authorized by:   Syliva Overman MD   Signed by:   Syliva Overman MD on 04/05/2010   Method used:   Electronically to        [facility] (retail)       [address]       [place], [place]  [address]       Ph: [phone]       Fax: [phone]   RxID:   [id number]    Orders Added: 1)  Est. Patient 40-64 years [99396] 2)  T-Basic Metabolic Panel [phone] 3)  T-Hepatic Function [phone] 4)  T-Lipid Profile [80061-22930] 5)  T-CBC w/Diff [[id number]] 6)  T- Hemoglobin A1C [83036-23375] 7)  T-TSH [76160-73710] 8)  T-Urine Microalbumin w/creat. ratio [[id number]] 9)  Radiology Referral  [Radiology]     Laboratory Results  Date/Time Received: April 06, 2010 7:47 AM  Date/Time Reported: April 06, 2010 7:47 AM   Stool - Occult Blood Hemmoccult #1: negative Date: 04/06/2010 Comments: 118 12-10 51301 13L 10-13 Mauricia Area CMA  April 06, 2010 7:47 AM

## 2010-07-06 NOTE — Letter (Signed)
Summary: [facility]  [facility]   Imported By: Lind Guest 04/07/2010 13:22:23  _____________________________________________________________________  External Attachment:    Type:   Image     Comment:   External Document

## 2010-07-06 NOTE — Progress Notes (Signed)
Summary: speak with nurse  Phone Note Call from Patient   Summary of Call: pt has needs to speak with nurse about her medicines and bunch of other stuff. [phone] ext [phone]  after 5:00 [phone] Initial call taken by: Rudene Anda,  March 21, 2010 11:16 AM  Follow-up for Phone Call        patient made appt Follow-up by: Adella Hare LPN,  March 22, 2010 4:19 PM

## 2010-07-07 ENCOUNTER — Telehealth: Payer: Self-pay | Admitting: Family Medicine

## 2010-07-12 NOTE — Progress Notes (Signed)
Summary: meter  Phone Note Call from Patient   Summary of Call: pt needs to talk with nurse about getting a new sugar meter. [phone] Initial call taken by: Rudene Anda,  July 07, 2010 1:25 PM  Follow-up for Phone Call        supplies sent as requested Follow-up by: Adella Hare LPN,  July 07, 2010 1:54 PM    New/Updated Medications: Alesia Morin W/DEVICE KIT (BLOOD GLUCOSE MONITORING SUPPL) once daily testing dx:250.00 ONETOUCH ULTRA BLUE  STRP (GLUCOSE BLOOD) once daily testing dx: 250.00 * ONETOUCH ULTRASMART STRIPS AND LANCETS once daily testing dx:250.00 Prescriptions: ONETOUCH ULTRASMART STRIPS AND LANCETS once daily testing dx:250.00  #100 x 2   Entered by:   Adella Hare LPN   Authorized by:   Syliva Overman MD   Signed by:   Adella Hare LPN on 34/74/2595   Method used:   Printed then [facility]ed [facility] ...       [facility]       [address]       [place], [place]  [address]       Ph: [phone]       Fax: [phone]   RxID:   [id number] [place] BLUE  STRP (GLUCOSE BLOOD) once daily testing dx: 250.00  #100 x 2   Entered by:   Adella Hare LPN   Authorized by:   Syliva Overman MD   Signed by:   Adella Hare LPN on 42/70/6237   Method used:   Electronically to        [facility] (retail)       [address]       [place], [place]  [address]       Ph: [phone]       Fax: [phone]   RxID:   [id number] Alesia Morin W/DEVICE KIT (BLOOD GLUCOSE MONITORING SUPPL) once daily testing dx:250.00  #1 x 0   Entered by:   Adella Hare LPN   Authorized by:   Syliva Overman MD   Signed by:   Adella Hare LPN on 37/16/9678   Method used:   Electronically to        [facility] (retail)       [address]       [place], [place]  [address]       Ph: [phone]       Fax: [phone]   RxID:    [id number]

## 2010-07-21 ENCOUNTER — Telehealth: Payer: Self-pay | Admitting: Family Medicine

## 2010-07-24 ENCOUNTER — Encounter: Payer: Self-pay | Admitting: Family Medicine

## 2010-07-24 ENCOUNTER — Telehealth: Payer: Self-pay | Admitting: Family Medicine

## 2010-07-26 NOTE — Progress Notes (Signed)
Summary: medicines  Phone Note Call from Patient   Summary of Call: her mail order rx for 90 day supply she had to send 30 days to a retail pharmacy and the company told her that she could transfer the other 60 days over. Then could not do it now so the mail order company will be faxing over rx for her lisinopril, metformin and atenolol she wante you to beaware that she is not taking all this to fast. it was all a mess up from the mail order company. Please send in these rx for her Initial call taken by: Lind Guest,  July 21, 2010 8:45 AM  Follow-up for Phone Call        noted Follow-up by: Adella Hare LPN,  July 21, 2010 9:49 AM

## 2010-08-01 NOTE — Progress Notes (Signed)
Summary: medicine  Phone Note Call from Patient   Summary of Call: needs to speak with nurse about her blood pressure medicine. [facility] states they faxed something to our office on 07/20/2010. [phone] Initial call taken by: Rudene Anda,  July 24, 2010 1:10 PM  Follow-up for Phone Call        returned call, no answer  meds requested have been faxed Follow-up by: Adella Hare LPN,  July 24, 2010 1:35 PM    Prescriptions: ATENOLOL 25 MG  TABS (ATENOLOL) one tab by mouth once daily  #90 x 0   Entered by:   Adella Hare LPN   Authorized by:   Syliva Overman MD   Signed by:   Adella Hare LPN on 24/40/1027   Method used:   Faxed to ...       [facility] (mail-order)             , [place]         Ph: [phone]       Fax: [phone]   RxID:   [id number] METFORMIN HCL 500 MG TABS (METFORMIN HCL) Take 1 tablet by mouth two times a day  #180 x 0   Entered by:   Adella Hare LPN   Authorized by:   Syliva Overman MD   Signed by:   Adella Hare LPN on 63/06/6008   Method used:   Faxed to ...       [facility] (mail-order)             , [place]         Ph: [phone]       Fax: [phone]   RxID:   [id number] LISINOPRIL-HYDROCHLOROTHIAZIDE 20-25 MG  TABS (LISINOPRIL-HYDROCHLOROTHIAZIDE) one tab by mouth once daily  #90 x 0   Entered by:   Adella Hare LPN   Authorized by:   Syliva Overman MD   Signed by:   Adella Hare LPN on 10/62/6948   Method used:   Faxed to ...       [facility] (mail-order)             , [place]         Ph: [phone]       Fax: [phone]   RxID:   [id number]

## 2010-08-01 NOTE — Medication Information (Signed)
Summary: Tax adviser   Imported By: Lind Guest 07/24/2010 14:52:24  _____________________________________________________________________  External Attachment:    Type:   Image     Comment:   External Document

## 2010-08-31 ENCOUNTER — Encounter: Payer: Self-pay | Admitting: Family Medicine

## 2010-09-04 ENCOUNTER — Telehealth: Payer: Self-pay | Admitting: Family Medicine

## 2010-09-04 NOTE — Telephone Encounter (Signed)
This is ok

## 2010-09-27 ENCOUNTER — Encounter: Payer: Self-pay | Admitting: Family Medicine

## 2010-10-02 ENCOUNTER — Encounter: Payer: Self-pay | Admitting: Family Medicine

## 2010-10-03 ENCOUNTER — Ambulatory Visit (INDEPENDENT_AMBULATORY_CARE_PROVIDER_SITE_OTHER): Payer: 59 | Admitting: Family Medicine

## 2010-10-03 ENCOUNTER — Encounter: Payer: Self-pay | Admitting: Family Medicine

## 2010-10-03 VITALS — BP 90/60 | HR 62 | Resp 16 | Ht 64.5 in | Wt 190.4 lb

## 2010-10-03 DIAGNOSIS — F329 Major depressive disorder, single episode, unspecified: Secondary | ICD-10-CM

## 2010-10-03 DIAGNOSIS — F3289 Other specified depressive episodes: Secondary | ICD-10-CM

## 2010-10-03 DIAGNOSIS — I10 Essential (primary) hypertension: Secondary | ICD-10-CM

## 2010-10-03 DIAGNOSIS — E119 Type 2 diabetes mellitus without complications: Secondary | ICD-10-CM

## 2010-10-03 DIAGNOSIS — L259 Unspecified contact dermatitis, unspecified cause: Secondary | ICD-10-CM

## 2010-10-03 DIAGNOSIS — E785 Hyperlipidemia, unspecified: Secondary | ICD-10-CM

## 2010-10-03 DIAGNOSIS — Z23 Encounter for immunization: Secondary | ICD-10-CM

## 2010-10-03 MED ORDER — LISINOPRIL-HYDROCHLOROTHIAZIDE 10-12.5 MG PO TABS: 1.0000 | ORAL_TABLET | Freq: Every day | ORAL | Status: DC

## 2010-10-03 MED ORDER — PRAVASTATIN SODIUM 40 MG PO TABS: 40.0000 mg | ORAL_TABLET | Freq: Every evening | ORAL | Status: DC

## 2010-10-03 NOTE — Assessment & Plan Note (Signed)
Over corrected reduce med dose in half

## 2010-10-03 NOTE — Assessment & Plan Note (Signed)
Medication compliance addressed. Commitment to regular exercise and healthy  food choices, with portion control discussed. DASH diet and low fat diet discussed and literature offered. Changes in medication made at this visit.  

## 2010-10-03 NOTE — Patient Instructions (Signed)
F/u in 3 months.  Dose reduction in your blood pressure medication, and also new med for cholesterol.  Labs fasting in 3 months.  It is important that you exercise regularly at least 30 minutes 5 times a week. If you develop chest pain, have severe difficulty breathing, or feel very tired, stop exercising immediately and seek medical attention  A healthy diet is rich in fruit, vegetables and whole grains. Poultry fish, nuts and beans are a healthy choice for protein rather then red meat. A low sodium diet and drinking 64 ounces of water daily is generally recommended. Oils and sweet should be limited. Carbohydrates especially for those who are diabetic or overweight, should be limited to 34-45 gram per meal. It is important to eat on a regular schedule, at least 3 times daily. Snacks should be primarily fruits, vegetables or nuts. Your blood sugars are well controlled

## 2010-10-08 DIAGNOSIS — F3289 Other specified depressive episodes: Secondary | ICD-10-CM | POA: Insufficient documentation

## 2010-10-08 DIAGNOSIS — F329 Major depressive disorder, single episode, unspecified: Secondary | ICD-10-CM | POA: Insufficient documentation

## 2010-10-08 NOTE — Progress Notes (Signed)
  Subjective:    Patient ID: [name], female    DOB: Oct 13, 1950, 60 y.o.   MRN: [id number]  HPI HYPERTENSION Disease Monitoring Blood pressure range-110 to 120 /80 Chest pain- no      Dyspnea- no Medications Compliance- good Lightheadedness- no   Edema- no   DIABETES Disease Monitoring Blood Sugar ranges-110 to 130 fasting Polyuria- no New Visual problems- no Medications Compliance- good Hypoglycemic symptoms- no   HYPERLIPIDEMIA Disease Monitoring See symptoms for Hypertension Medications Compliance- good RUQ pain- no  Muscle aches- no  Pt in for f/u and review of recent lab data.  She states she feels well, and has no new concerns. She does need a pneumovac and is agreeable. She has her blood pressure and sugar checked on the job by the nurse there and gets a good report   Review of Systems Denies recent fever or chills. Denies sinus pressure, nasal congestion, ear pain or sore throat. Denies chest congestion, productive cough or wheezing. Denies chest pains, palpitations, paroxysmal nocturnal dyspnea, orthopnea and leg swelling Denies abdominal pain, nausea, vomiting,diarrhea or constipation.  Denies rectal bleeding or change in bowel movement. Denies dysuria, frequency, hesitancy or incontinence. Denies joint pain, swelling and limitation in mobility. Denies headaches, seizure, numbness, or tingling. Denies uncontrolled depression, anxiety or insomnia.Her grand-daughter is a big part of her ;life now and this makes her happy Denies skin break down or rash.        Objective:   Physical Exam Patient alert and oriented and in no Cardiopulmonary distress.  HEENT: No facial asymmetry, EOMI, no sinus tenderness, TM's clear, Oropharynx pink and moist.  Neck supple no adenopathy.  Chest: Clear to auscultation bilaterally.  CVS: S1, S2 no murmurs, no S3.  ABD: Soft non tender. Bowel sounds normal.  Ext: No edema  MS: Adequate ROM spine, shoulders, hips and  knees.  Skin: Intact, no ulcerations or rash noted.  Psych: Good eye contact, normal affect. Memory intact not anxious or depressed appearing.  CNS: CN 2-12 intact, power, tone and sensation normal throughout.        Assessment & Plan:

## 2010-10-08 NOTE — Assessment & Plan Note (Signed)
Improved and stable on current med which will remain, her social life is also improved and stable which helps alot

## 2010-10-08 NOTE — Assessment & Plan Note (Signed)
Deteriorated on left leg , improved on right, no cure , has seen many dermatologists

## 2010-10-08 NOTE — Assessment & Plan Note (Signed)
Improved Controlled, no change in medication  

## 2010-10-17 NOTE — H&P (Signed)
[name], [name]               ACCOUNT NO.:  [id number]   MEDICAL RECORD NO.:  [id number]          PATIENT TYPE:  AMB   LOCATION:  DAY                           FACILITY:  [facility]   PHYSICIAN:  Tilda Burrow, M.D. DATE OF BIRTH:  29-Sep-1950   DATE OF ADMISSION:  02/13/2007  DATE OF DISCHARGE:  LH                              HISTORY & PHYSICAL   ADMISSION DIAGNOSIS:  Postmenopausal bleeding, suspected endometrial  polyp, admitted for hysteroscopy and D&C.   HISTORY OF PRESENT ILLNESS:  This 60 year old postmenopausal female has  been referred to our office at the courtesy of Dr. Syliva Overman for  evaluation of abnormal postmenopausal bleeding.  She was referred in  August, had ultrasound and possible biopsy.  Exam showed a hyperechoic  area in the endometrium suggesting a 1.3-cm mass in maximum transverse  diameter within the endometrial cavity with a midposition uterus, 6.9 cm  in length.  The adnexa were benign with ovaries obscured by bowel and  bowel contents.  The biopsy has returned suggesting benign endometrial  epithelium consistent with endometrial lining.  There was not a huge  amount of sample obtained.  She is therefore admitted both for  hysteroscopy to look inside the uterus to confirm what was felt to be on  ultrasound an endometrial polyp and to remove tissue.  She has had no  recent postmenopausal bleeding since the time of that exam.   PAST MEDICAL HISTORY:  1. Notable for arthritis, particularly involving the right elbow.  2. Mild hypercholesterolemia.   SURGICAL HISTORY:  Negative.   MEDICATIONS:  Atenolol, lovastatin, lisinopril.   HABITS:  Cigarettes, alcohol.  Recreational drugs denied.   PHYSICAL EXAMINATION:  VITAL SIGNS:  Height 5 feet 5 inches, weight 196.  Blood pressure 140/80.  GENERAL:  Exam shows a cheerful, anxious Caucasian female, alert and  oriented x3.  EYES:  Pupils are equal, round and reactive.  NECK: Supple.  Normal thyroid.  CHEST:  Clear to auscultation.  ABDOMEN:  Nontender.  Moderate obesity.  PELVIC:  External genitalia normal for patient's age.  Normal pelvic  support.  Cervix:  Nonpurulent.  Pap smear at Dr. Anthony Sar was reported  as normal by patient.  Uterus anteflexed.  Adnexa negative for masses.  Ultrasound shows the above-mentioned diameters.   PLAN:  Hysteroscopy and D&C for postmenopausal bleeding and removal  suspected endometrial polyp on February 13, 2007.      Tilda Burrow, M.D.  Electronically Signed     JVF/MEDQ  D:  02/12/2007  T:  02/13/2007  Job:  [id number]   cc:   Milus Mallick. Lodema Hong, M.D.  Fax: [phone]   [facility]

## 2010-10-17 NOTE — Op Note (Signed)
[name], [name]               ACCOUNT NO.:  [id number]   MEDICAL RECORD NO.:  [id number]          PATIENT TYPE:  AMB   LOCATION:  DAY                           FACILITY:  [facility]   PHYSICIAN:  Tilda Burrow, M.D. DATE OF BIRTH:  Apr 21, 1951   DATE OF PROCEDURE:  02/13/2007  DATE OF DISCHARGE:                               OPERATIVE REPORT   PREOPERATIVE DIAGNOSIS:  Endometrial polyp, post menopausal bleeding.   POSTOPERATIVE DIAGNOSIS:  Endometrial polyp, post menopausal bleeding.   PROCEDURE:  Hysteroscopy, dilatation and curettage, excision of large  endometrial polyp.   SURGEON:  Tilda Burrow, M.D.   ASSISTANT:  None.   ANESTHESIA:  General with LMA.   COMPLICATIONS:  None.   FINDINGS:  Large 1.5 cm by 2 cm posterior endometrial polyp excised and  removed in fragments.   DETAILS OF PROCEDURE:  The patient was taken to the operating room,  prepped and draped for a vaginal procedure.  The cervix was grasped and  uterus sounded to 7 cm with the endocervical canal dilated to 27 [place].  The 30 degree rigid hysteroscope was introduced identifying the  endometrial cavity, the tubal ostia, and identifying a large polyp  originating from a broad stalk on the posterior lower uterine segment.  The hysteroscope was retracted to allow access to the polyp and  hysteroscopic scissors used to transect approximately halfway through  the polyp.  A smooth sharp curettage was then attempted to try to remove  it.  We removed a couple of small fragments.  [place] grasping  forceps were used to probe the endometrial cavity and we were not able  to extract it due to the broad base still being too firm.  So,  additional hysteroscopic resection of the pedicle was performed.  The  polyp was too large to remove without further dilating the lower uterine  segment to a 31 [place] at which time, the grasping device could be used  to extract the polyp.  Repeat hysteroscopy then allowed [place]  to visualize  the endometrial cavity and photodocument that the polyp was removed in  its entirety and there was no suggestion of perforation or injury to the  uterus.  The patient received IV antibiotic prophylaxis during the case  and went to the recovery room in stable condition.  She will be  discharged home for follow up in two weeks for a discussion of results.     Tilda Burrow, M.D.  Electronically Signed    JVF/MEDQ  D:  02/13/2007  T:  02/13/2007  Job:  [id number]

## 2010-10-17 NOTE — Procedures (Signed)
LOWER EXTREMITY VENOUS REFLUX EXAM   INDICATION:  Bilateral lower extremity spider veins.   EXAM:  Using color-flow imaging and pulse Doppler spectral analysis, the  right and left common femoral, superficial femoral, popliteal, posterior  tibial, greater and lesser saphenous veins are evaluated.  There is no  evidence suggesting deep venous insufficiency in the right and left  lower extremity.   The right and left saphenofemoral junction is competent.  The left GSV  is not competent.   The right and left proximal short saphenous vein demonstrates  competency.   GSV Diameter (used if found to be incompetent only)                                            Right    Left  Proximal Greater Saphenous Vein           cm       cm  Proximal-to-mid-thigh                     cm       cm  Mid thigh                                 cm       cm  Mid-distal thigh                          cm       cm  Distal thigh                              cm       cm  Knee                                      cm       cm   IMPRESSION:  1. Left greater saphenous vein demonstrated mild incompetency.  2. The right and left greater saphenous veins are not aneurysmal.  3. The right and left greater saphenous veins are not tortuous.  4. The deep venous system is competent.  5. The right and left lesser saphenous veins are competent.  6. There is no evidence of deep or superficial venous thrombosis      bilaterally.   ___________________________________________  Quita Skye Hart [place], M.D.   AC/MEDQ  D:  01/31/2009  T:  01/31/2009  Job:  [id number]

## 2010-10-17 NOTE — Consult Note (Signed)
NEW PATIENT CONSULTATION   [name], [name]  DOB:  [birth year]                                       01/31/2009  ZOXWR#:[id number]   [name] is a 60 year old female referred for possible stasis  dermatitis of the left leg.  She states that about a year ago she began  having a rash in the lower leg on the left particularly but also  somewhat on the right.  This has become scaly and itchy, and she does  have some aching and throbbing discomfort as the day progresses.  She  has no history of bulging varicosities, distal swelling, bleeding, or  ulceration, and has no history of thrombophlebitis or deep vein  thrombosis.  There has been some question about some cellulitis  originating from the skin rash in the past.  She does not wear elastic  compression stockings.  She does elevate her legs periodically but not  work and this does help her discomfort.  She takes occasional Advil.   PAST MEDICAL HISTORY:  1. Hypertension.  2. Negative for diabetes, coronary artery disease, COPD, stroke,      hyperlipidemia.   PAST SURGICAL HISTORY:  D and C.   FAMILY HISTORY:  Positive for coronary artery disease in both parents  and sisters.  Diabetes in mother.  Varicose veins which were severe in  her mother.  Negative for stroke.   SOCIAL HISTORY:  She is widowed and works actively as an Production assistant, radio.  She does not use tobacco or alcohol.   REVIEW OF SYSTEMS:  Positive for weight loss and depression.  Negative  for other systems.  Please see health history form.   ALLERGIES:  None known.   MEDICATIONS:  Please see health history form.   PHYSICAL EXAM:  Blood pressure 154/88, heart rate 81, respirations 14.  General:  She is a healthy-appearing, middle-aged female in no apparent  distress, alert and oriented x3.  Neck:  Supple, 3+ carotid pulses  palpable.  No bruits are audible.  Neurologic:  Normal.  No palpable  adenopathy in neck.  Chest:  Clear to  auscultation.  Cardiovascular:  Regular rhythm.  No murmurs.  Abdomen:  Obese.  No palpable masses.  She  has 3+ femoral popliteal and dorsalis pedis pulses bilaterally.  Both  feet are well perfused.  She has a maculopapular rash in the lateral  aspects of her left leg beginning in the mid-lower leg extending down  toward the ankle and also medially.  There is no hyperpigmentation or  discrete ulceration and no distal edema is noted.  No bulging  varicosities are noted but she does have extensive reticular and spider  veins along the anterior thigh areas bilaterally.  Right leg has no  bulging varicosities but has some similar type rash on the lateral  aspect of the right lower leg with no ulceration.  Venous duplex exam  reveals a normal deep system bilaterally and some very mild segment of  reflux in the left thigh but not at the saphenofemoral junction, and  both small saphenous veins were normal.   I do not think her rash is due to deep venous insufficiency and it is  somewhat peculiar in appearance.  She does not have severe reflux in  either the deep or superficial system to account for this.  Any  treatment  would be aimed at her reticular/spider veins which would be  primary sclerotherapy and she will consider this.  But, I do not think  this is affecting the symptomatology which she expressed regarding her  skin rash.   Quita Skye Hart [place], M.D.  Electronically Signed   JDL/MEDQ  D:  01/31/2009  T:  01/31/2009  Job:  [id number]   cc:   Milus Mallick. Lodema Hong, M.D.

## 2010-10-20 NOTE — H&P (Signed)
[name], [name]               ACCOUNT NO.:  [id number]   MEDICAL RECORD NO.:  [id number]          PATIENT TYPE:  AMB   LOCATION:                                FACILITY:  [facility]   PHYSICIAN:  Dalia Heading, M.D.  DATE OF BIRTH:  06-19-1950   DATE OF ADMISSION:  03/27/2005  DATE OF DISCHARGE:  LH                                HISTORY & PHYSICAL   CHIEF COMPLAINT:  Need for screening colonoscopy.   HISTORY OF PRESENT ILLNESS:  The patient is a 60 year old, white female who  is referred for endoscopic evaluation.  She needs colonoscopy for screening  purposes.  No abdominal pain, weight loss, nausea, vomiting, diarrhea,  constipation, melena or hematochezia have been noted.  She has never had a  colonoscopy.  There is no family history of colon carcinoma.   PAST MEDICAL HISTORY:  Hypertension.   PAST SURGICAL HISTORY:  Breast biopsies.   CURRENT MEDICATIONS:  Lisinopril and Atenolol.   ALLERGIES:  No known drug allergies.   REVIEW OF SYSTEMS:  Noncontributory.   PHYSICAL EXAMINATION:  GENERAL:  The patient is a well-developed, well-  nourished, white female in no acute distress.  LUNGS:  Clear to auscultation with equal breath sounds bilaterally.  HEART:  Regular rate and rhythm without S3, S4 or murmurs.  ABDOMEN:  Soft, nontender, nondistended.  No hepatosplenomegaly or masses or  noted.  RECTAL:  Deferred to the procedure.   IMPRESSION:  Need for screening colonoscopy.   PLAN:  The patient is scheduled for a colonoscopy on March 27, 2005.  The  risks and benefits of the procedure including bleeding and perforation were  fully explained to the patient gaining informed consent.      Dalia Heading, M.D.  Electronically Signed     MAJ/MEDQ  D:  03/13/2005  T:  03/13/2005  Job:  [id number]   cc:   Milus Mallick. Lodema Hong, M.D.  Fax: [phone]

## 2010-11-07 ENCOUNTER — Encounter: Payer: Self-pay | Admitting: *Deleted

## 2010-11-09 ENCOUNTER — Ambulatory Visit (INDEPENDENT_AMBULATORY_CARE_PROVIDER_SITE_OTHER): Payer: 59

## 2010-11-09 DIAGNOSIS — I83893 Varicose veins of bilateral lower extremities with other complications: Secondary | ICD-10-CM

## 2010-12-04 ENCOUNTER — Ambulatory Visit (INDEPENDENT_AMBULATORY_CARE_PROVIDER_SITE_OTHER): Payer: 59 | Admitting: Family Medicine

## 2010-12-04 VITALS — BP 130/80 | Wt 192.1 lb

## 2010-12-04 DIAGNOSIS — N3000 Acute cystitis without hematuria: Secondary | ICD-10-CM

## 2010-12-04 LAB — POCT URINALYSIS DIPSTICK: pH, UA: 5.5

## 2010-12-04 MED ORDER — CIPROFLOXACIN HCL 500 MG PO TABS: 500.0000 mg | ORAL_TABLET | Freq: Two times a day (BID) | ORAL | Status: AC

## 2010-12-04 NOTE — Progress Notes (Signed)
  Subjective:    Patient ID: [name], female    DOB: 16-Feb-1951, 60 y.o.   MRN: [id number]  HPI Pt in with cystitis symptoms, cCUA positive for infectuion, cipro prescribed and culture sent   Review of Systems     Objective:   Physical Exam        Assessment & Plan:

## 2010-12-08 LAB — URINE CULTURE

## 2011-01-12 ENCOUNTER — Encounter: Payer: Self-pay | Admitting: Family Medicine

## 2011-01-15 ENCOUNTER — Encounter: Payer: Self-pay | Admitting: Family Medicine

## 2011-01-16 ENCOUNTER — Ambulatory Visit: Payer: 59 | Admitting: Family Medicine

## 2011-03-16 LAB — CBC
HCT: 39.9
Hemoglobin: 13.5
RDW: 13.9

## 2011-03-16 LAB — COMPREHENSIVE METABOLIC PANEL
Alkaline Phosphatase: 98
BUN: 18
Chloride: 101
Glucose, Bld: 116 — ABNORMAL HIGH
Potassium: 3.9
Total Bilirubin: 1
Total Protein: 6.8

## 2011-05-22 ENCOUNTER — Other Ambulatory Visit: Payer: Self-pay | Admitting: Family Medicine

## 2011-06-06 ENCOUNTER — Telehealth: Payer: Self-pay

## 2011-06-06 NOTE — Telephone Encounter (Signed)
Needs cbc, fasing lipids, cmp and egfr, hba1c , tsh, and vit D fasting pls

## 2011-06-07 NOTE — Telephone Encounter (Signed)
Pt to bring a copy of completed blood work from her job. To compare to ordered labs.

## 2011-07-12 ENCOUNTER — Telehealth: Payer: Self-pay | Admitting: Family Medicine

## 2011-07-12 ENCOUNTER — Other Ambulatory Visit (HOSPITAL_COMMUNITY)
Admission: RE | Admit: 2011-07-12 | Discharge: 2011-07-12 | Disposition: A | Discharge status: Home / Self Care | Payer: BC Managed Care – PPO | Source: Ambulatory Visit | Attending: Family Medicine | Admitting: Family Medicine

## 2011-07-12 ENCOUNTER — Encounter: Payer: Self-pay | Admitting: Family Medicine

## 2011-07-12 ENCOUNTER — Ambulatory Visit (INDEPENDENT_AMBULATORY_CARE_PROVIDER_SITE_OTHER): Payer: BC Managed Care – PPO | Admitting: Family Medicine

## 2011-07-12 ENCOUNTER — Other Ambulatory Visit: Payer: Self-pay | Admitting: Family Medicine

## 2011-07-12 VITALS — BP 120/70 | HR 64 | Resp 18 | Ht 64.5 in | Wt 188.0 lb

## 2011-07-12 DIAGNOSIS — Z01419 Encounter for gynecological examination (general) (routine) without abnormal findings: Secondary | ICD-10-CM | POA: Insufficient documentation

## 2011-07-12 DIAGNOSIS — E119 Type 2 diabetes mellitus without complications: Secondary | ICD-10-CM

## 2011-07-12 DIAGNOSIS — I10 Essential (primary) hypertension: Secondary | ICD-10-CM

## 2011-07-12 DIAGNOSIS — F329 Major depressive disorder, single episode, unspecified: Secondary | ICD-10-CM

## 2011-07-12 DIAGNOSIS — E669 Obesity, unspecified: Secondary | ICD-10-CM

## 2011-07-12 DIAGNOSIS — F3289 Other specified depressive episodes: Secondary | ICD-10-CM

## 2011-07-12 DIAGNOSIS — E785 Hyperlipidemia, unspecified: Secondary | ICD-10-CM

## 2011-07-12 DIAGNOSIS — Z139 Encounter for screening, unspecified: Secondary | ICD-10-CM

## 2011-07-12 DIAGNOSIS — Z0000 Encounter for general adult medical examination without abnormal findings: Secondary | ICD-10-CM

## 2011-07-12 DIAGNOSIS — L259 Unspecified contact dermatitis, unspecified cause: Secondary | ICD-10-CM

## 2011-07-12 DIAGNOSIS — Z13820 Encounter for screening for osteoporosis: Secondary | ICD-10-CM

## 2011-07-12 LAB — POC HEMOCCULT BLD/STL (OFFICE/1-CARD/DIAGNOSTIC): Fecal Occult Blood, POC: NEGATIVE

## 2011-07-12 MED ORDER — CITALOPRAM HYDROBROMIDE 20 MG PO TABS: 20.0000 mg | ORAL_TABLET | Freq: Every day | ORAL | Status: DC

## 2011-07-12 NOTE — Progress Notes (Signed)
  Subjective:    Patient ID: [name], female    DOB: 03-Dec-1950, 61 y.o.   MRN: [id number]  HPI The PT is here for follow up and re-evaluation of chronic medical conditions, medication management and review of any available recent lab and radiology data.  Preventive health is updated, specifically  Cancer screening and Immunization.   Questions or concerns regarding consultations or procedures which the PT has had in the interim are  Addressed.Had vascular work done on right leg with excellent result, treatment to left leg for dermatitis which has done well, has upcoming vascular work on left leg The PT denies any adverse reactions to current medications since the last visit.  C/o feeling somewhat overwhelmed and depressed though overall life is going extremely well, interested in inc dose of antidepressant, not suicidal or homicidal       Review of Systems See HPI Denies recent fever or chills. Denies sinus pressure, nasal congestion, ear pain or sore throat. Denies chest congestion, productive cough or wheezing. Denies chest pains, palpitations and leg swelling Denies abdominal pain, nausea, vomiting,diarrhea or constipation.   Denies dysuria, frequency, hesitancy or incontinence. Denies joint pain, swelling and limitation in mobility. Denies headaches, seizures, numbness, or tingling.       Objective:   Physical Exam  Pleasant well nourished female, alert and oriented x 3, in no cardio-pulmonary distress. Afebrile. HEENT No facial trauma or asymetry. Sinuses non tender.  EOMI, PERTL, fundoscopic exam is normal, no hemorhage or exudate.  External ears normal, tympanic membranes clear. Oropharynx moist, no exudate, good dentition. Neck: supple, no adenopathy,JVD or thyromegaly.No bruits.  Chest: Clear to ascultation bilaterally.No crackles or wheezes. Non tender to palpation  Breast: No asymetry,no masses. No nipple discharge or inversion. No axillary or  supraclavicular adenopathy  Cardiovascular system; Heart sounds normal,  S1 and  S2 ,no S3.  No murmur, or thrill. Apical beat not displaced Peripheral pulses normal.  Abdomen: Soft, non tender, no organomegaly or masses. No bruits. Bowel sounds normal. No guarding, tenderness or rebound.  Rectal:  No mass. Guaiac negative stool.  GU: External genitalia normal. No lesions. Vaginal canal normal.No discharge. Uterus normal size, no adnexal masses, no cervical motion or adnexal tenderness.  Musculoskeletal exam: Full ROM of spine, hips , shoulders and knees. No deformity ,swelling or crepitus noted. No muscle wasting or atrophy.   Neurologic: Cranial nerves 2 to 12 intact. Power, tone ,sensation and reflexes normal throughout. No disturbance in gait. No tremor.  Skin: Intact, no ulceration, erythema , scaling or rash noted. Pigmentation normal throughout  Psych; Normal mood and affect. Judgement and concentration normal Diabetic Foot Check:  Appearance - no lesions, ulcers or calluses Skin - no unusual pallor or redness Sensation - grossly intact to light touch Monofilament testing -  Right - Great toe, medial, central, lateral ball and posterior foot intact Left - Great toe, medial, central, lateral ball and posterior foot intact Pulses Left - Dorsalis Pedis and Posterior Tibia normal Right - Dorsalis Pedis and Posterior Tibia normal       Assessment & Plan:

## 2011-07-12 NOTE — Telephone Encounter (Signed)
Patient is aware 

## 2011-07-12 NOTE — Assessment & Plan Note (Signed)
Improved, has upcoming appt for laser treatment to left lower ext

## 2011-07-12 NOTE — Assessment & Plan Note (Signed)
Updated  Lab needed, pt aware

## 2011-07-12 NOTE — Assessment & Plan Note (Signed)
Improved. Pt applauded on succesful weight loss through lifestyle change, and encouraged to continue same. Weight loss goal set for the next several months.  

## 2011-07-12 NOTE — Assessment & Plan Note (Signed)
Uncontrolled, dose inc on med 

## 2011-07-12 NOTE — Assessment & Plan Note (Signed)
Controlled, no change in medication  

## 2011-07-12 NOTE — Patient Instructions (Signed)
F/u in 5 month and 3 weeks. Dose increase on citalopram as discussed, ok to take two 10mg  tabs daily till done, then new dose is 20mg  one daily  It is important that you exercise regularly at least 30 minutes 5 times a week. If you develop chest pain, have severe difficulty breathing, or feel very tired, stop exercising immediately and seek medical attention   A healthy diet is rich in fruit, vegetables and whole grains. Poultry fish, nuts and beans are a healthy choice for protein rather then red meat. A low sodium diet and drinking 64 ounces of water daily is generally recommended. Oils and sweet should be limited. Carbohydrates especially for those who are diabetic or overweight, should be limited to 34-45 gram per meal. It is important to eat on a regular schedule, at least 3 times daily. Snacks should be primarily fruits, vegetables or nuts.  Labs fasting cbc, cmp, lipid, hBa1C , tsh, vit D asap Microalb today  You are being referred for bone density scan and mammogram

## 2011-07-13 LAB — MICROALBUMIN / CREATININE URINE RATIO
Microalb Creat Ratio: 6.9 mg/g (ref 0.0–30.0)
Microalb, Ur: 0.5 mg/dL (ref 0.00–1.89)

## 2011-07-13 MED ORDER — ATENOLOL 25 MG PO TABS: 25.0000 mg | ORAL_TABLET | Freq: Every day | ORAL | Status: DC

## 2011-07-13 MED ORDER — METFORMIN HCL 500 MG PO TABS: 500.0000 mg | ORAL_TABLET | Freq: Two times a day (BID) | ORAL | Status: DC

## 2011-07-13 MED ORDER — PRAVASTATIN SODIUM 40 MG PO TABS: 40.0000 mg | ORAL_TABLET | Freq: Every evening | ORAL | Status: DC

## 2011-07-13 MED ORDER — LISINOPRIL-HYDROCHLOROTHIAZIDE 10-12.5 MG PO TABS: 1.0000 | ORAL_TABLET | Freq: Every day | ORAL | Status: DC

## 2011-07-16 ENCOUNTER — Ambulatory Visit (HOSPITAL_COMMUNITY)
Admission: RE | Admit: 2011-07-16 | Discharge: 2011-07-16 | Disposition: A | Discharge status: Home / Self Care | Payer: BC Managed Care – PPO | Source: Ambulatory Visit | Attending: Family Medicine | Admitting: Family Medicine

## 2011-07-16 DIAGNOSIS — Z13820 Encounter for screening for osteoporosis: Secondary | ICD-10-CM

## 2011-07-16 DIAGNOSIS — Z780 Asymptomatic menopausal state: Secondary | ICD-10-CM | POA: Insufficient documentation

## 2011-07-16 DIAGNOSIS — Z1231 Encounter for screening mammogram for malignant neoplasm of breast: Secondary | ICD-10-CM | POA: Insufficient documentation

## 2011-07-16 DIAGNOSIS — Z139 Encounter for screening, unspecified: Secondary | ICD-10-CM

## 2011-07-18 ENCOUNTER — Telehealth: Payer: Self-pay | Admitting: Family Medicine

## 2011-07-20 ENCOUNTER — Other Ambulatory Visit: Payer: Self-pay | Admitting: Family Medicine

## 2011-07-20 MED ORDER — ACYCLOVIR 400 MG PO TABS: 400.0000 mg | ORAL_TABLET | Freq: Every day | ORAL | Status: AC

## 2011-07-20 NOTE — Telephone Encounter (Signed)
pls let her know med is sent in 

## 2011-07-20 NOTE — Telephone Encounter (Signed)
Left pt message that med requested was sent in

## 2011-07-24 ENCOUNTER — Other Ambulatory Visit: Payer: Self-pay | Admitting: Family Medicine

## 2011-07-24 ENCOUNTER — Telehealth: Payer: Self-pay | Admitting: Family Medicine

## 2011-07-24 MED ORDER — VITAMIN D3 1.25 MG (50000 UT) PO CAPS: 50000.0000 [IU] | ORAL_CAPSULE | ORAL | Status: DC

## 2011-07-24 NOTE — Telephone Encounter (Signed)
pls let pt know I received her  Drawn 2/14 Vit D is low , Iat 20, range is 30 to 100, I recommend weekly vit d for the next 6 months and will send in, blood sugar excellent, liver and kidneys are good, cholesterol is too high, copy of report at your desk toreview with her, she needs to reduce fatty food intake, if not bette next check med dose will be increased

## 2011-07-25 NOTE — Telephone Encounter (Signed)
Pt aware.

## 2011-08-07 ENCOUNTER — Other Ambulatory Visit: Payer: Self-pay | Admitting: Family Medicine

## 2011-08-07 ENCOUNTER — Telehealth: Payer: Self-pay | Admitting: Family Medicine

## 2011-08-07 MED ORDER — PROMETHAZINE HCL 12.5 MG PO TABS: 12.5000 mg | ORAL_TABLET | Freq: Four times a day (QID) | ORAL | Status: DC | PRN

## 2011-08-07 MED ORDER — DIPHENOXYLATE-ATROPINE 2.5-0.025 MG PO TABS: 1.0000 | ORAL_TABLET | Freq: Four times a day (QID) | ORAL | Status: DC | PRN

## 2011-08-07 NOTE — Telephone Encounter (Signed)
Phenergan tab and lomotil have been sent in, I left her a message you may want to try to call back, goo over the importance of hydration and BRAT diet also pls

## 2011-08-07 NOTE — Telephone Encounter (Signed)
I called patient and she is throwing up and having diarrhea, chills, no fever. Couldn't talk on the phone she was so nauseated and heaving. I told her there was a stomach bug going around and I could see if you would call her in something to [facility].

## 2011-08-08 NOTE — Telephone Encounter (Signed)
She did get the meds. She stopped vomiting lastnight but feels sore and achy. Advised she could use some tylenol but to follow BRAT diet and stay hydrated and to call if she needed anything

## 2011-09-03 ENCOUNTER — Encounter: Payer: Self-pay | Admitting: Family Medicine

## 2011-09-06 ENCOUNTER — Telehealth: Payer: Self-pay | Admitting: Family Medicine

## 2011-09-07 NOTE — Telephone Encounter (Signed)
Called and left message for pt to return.

## 2011-09-07 NOTE — Telephone Encounter (Signed)
Pt states that her right hip is hurting and throbbing more at night.  Advised pt to try aleve or some type of anti-inflammatory otc if no relief call office on Monday.

## 2011-10-23 ENCOUNTER — Ambulatory Visit (INDEPENDENT_AMBULATORY_CARE_PROVIDER_SITE_OTHER): Payer: BC Managed Care – PPO | Admitting: Family Medicine

## 2011-10-23 ENCOUNTER — Encounter: Payer: Self-pay | Admitting: Family Medicine

## 2011-10-23 VITALS — BP 142/68 | HR 72 | Resp 18 | Wt 189.1 lb

## 2011-10-23 DIAGNOSIS — M25559 Pain in unspecified hip: Secondary | ICD-10-CM | POA: Insufficient documentation

## 2011-10-23 DIAGNOSIS — M461 Sacroiliitis, not elsewhere classified: Secondary | ICD-10-CM

## 2011-10-23 MED ORDER — MELOXICAM 7.5 MG PO TABS: 7.5000 mg | ORAL_TABLET | Freq: Every day | ORAL | Status: DC

## 2011-10-23 MED ORDER — METHYLPREDNISOLONE ACETATE 40 MG/ML IJ SUSP
40.0000 mg | Freq: Once | INTRAMUSCULAR | Status: AC
  Administered 2011-10-23: 40 mg via INTRAMUSCULAR

## 2011-10-23 NOTE — Patient Instructions (Signed)
Take the anti-inflammatory once a day  You have been given a shot of prednisone Try the stretches If not better in 1 week please call for x-ray  Keep previous follow-up appt with Dr.Simpson

## 2011-10-23 NOTE — Progress Notes (Signed)
  Subjective:    Patient ID: [name], female    DOB: Apr 30, 1951, 61 y.o.   MRN: [id number]  HPI Pt presents with Hip pain for the past 3-4 weeks, no injury, pain when she lays on either side at night and pain in buttocks, has occ tingling in right leg, but denies sciatica like symptoms. Denies low back pain.Has used aleve which helps some but pain returns.  No change in bowel or bladder   Review of Systems  GEN- denies fatigue, fever, weight loss,weakness, recent illness HEENT- denies eye drainage, change in vision, nasal discharge, CVS- denies chest pain, palpitations RESP- denies SOB, cough, wheeze ABD- denies N/V, change in stools, abd pain GU- denies dysuria, hematuria, dribbling, incontinence MSK-+ joint pain, muscle aches, injury        Objective:   Physical Exam GEN- NAD, alert and oriented x3, obese ABD-NABS,soft,NT,ND HIP- No pain with IR/ER, TTP near trochanter [place]- TTP over bilat SI joints Back- neg SLR, lumbar spine NT, able to flex and squat without pain  EXT- No edema Neuro- sensation grossly in tact, DTR symmetric bilat, strength equal bilat Pulses- Radial, DP- 2+ Gait- non antalgic       Assessment & Plan:

## 2011-10-23 NOTE — Assessment & Plan Note (Signed)
She is pinpointing bilat SI joint pain, given handout on stretches, NSAIDS, depo medrol given today,

## 2011-10-23 NOTE — Assessment & Plan Note (Signed)
Differentials tronchanteric bursitis vs OA, no recent injury, treat conservatively NSAIDS, stretches, if no improvement will image HIP and refer to ortho if needed

## 2011-11-08 ENCOUNTER — Telehealth: Payer: Self-pay | Admitting: Family Medicine

## 2011-11-08 NOTE — Telephone Encounter (Signed)
Called pt and informed her that she would need to be evaluated in the office before medicine can be prescribed.

## 2011-11-09 ENCOUNTER — Encounter: Payer: Self-pay | Admitting: Family Medicine

## 2011-11-09 ENCOUNTER — Ambulatory Visit (INDEPENDENT_AMBULATORY_CARE_PROVIDER_SITE_OTHER): Payer: BC Managed Care – PPO | Admitting: Family Medicine

## 2011-11-09 VITALS — BP 122/80 | HR 80 | Resp 16 | Ht 64.5 in | Wt 189.0 lb

## 2011-11-09 DIAGNOSIS — N3000 Acute cystitis without hematuria: Secondary | ICD-10-CM

## 2011-11-09 DIAGNOSIS — E669 Obesity, unspecified: Secondary | ICD-10-CM

## 2011-11-09 DIAGNOSIS — N390 Urinary tract infection, site not specified: Secondary | ICD-10-CM

## 2011-11-09 DIAGNOSIS — I10 Essential (primary) hypertension: Secondary | ICD-10-CM

## 2011-11-09 LAB — POCT URINALYSIS DIPSTICK
Protein, UA: 100
Urobilinogen, UA: 1

## 2011-11-09 MED ORDER — CIPROFLOXACIN HCL 500 MG PO TABS: 500.0000 mg | ORAL_TABLET | Freq: Two times a day (BID) | ORAL | Status: AC

## 2011-11-09 NOTE — Assessment & Plan Note (Signed)
Symptomatic with abnormal CCUA, cipro prescribed, sent for c/s. Importance of adequate water intake and frequent voiding stressed

## 2011-11-09 NOTE — Assessment & Plan Note (Signed)
Unchanged. Patient re-educated about  the importance of commitment to a  minimum of 150 minutes of exercise per week. The importance of healthy food choices with portion control discussed. Encouraged to start a food diary, count calories and to consider  joining a support group. Sample diet sheets offered. Goals set by the patient for the next several months.    

## 2011-11-09 NOTE — Patient Instructions (Addendum)
F/u as before.  Fasting labs before next vist, please bring copy to visit    You are being treated for acute bl;adder infection, medication is sent in.  It is important that you exercise regularly at least 30 minutes 5 times a week. If you develop chest pain, have severe difficulty breathing, or feel very tired, stop exercising immediately and seek medical attention

## 2011-11-09 NOTE — Progress Notes (Signed)
  Subjective:    Patient ID: [name], female    DOB: 02/12/1951, 61 y.o.   MRN: [id number]  HPI 3 day h/o dysuria and frequency, denies flank pain, fever or chills No other concerns voiced. Health maintenance reviewed and is up to date   Review of Systems See HPI Denies recent fever or chills. Denies sinus pressure, nasal congestion, ear pain or sore throat. Denies chest congestion, productive cough or wheezing. Denies chest pains, palpitations and leg swelling Denies abdominal pain, nausea, vomiting,diarrhea or constipation.    Denies joint pain, swelling and limitation in mobility. Denies headaches, seizures, numbness, or tingling. Denies depression, anxiety or insomnia. Denies skin break down or rash.        Objective:   Physical Exam  Patient alert and oriented and in no cardiopulmonary distress.Mildly uncomfortable due to pelvic pressure and frequency  HEENT: No facial asymmetry, EOMI, no sinus tenderness,  oropharynx pink and moist.  Neck supple no adenopathy.  Chest: Clear to auscultation bilaterally.  CVS: S1, S2 no murmurs, no S3.  ABD: Soft non tender. Bowel sounds normal.  Ext: No edema  MS: Adequate ROM spine, shoulders, hips and knees.  Skin: Intact, no ulcerations or rash noted.  Psych: Good eye contact, normal affect. Memory intact not anxious or depressed appearing.  CNS: CN 2-12 intact, power, tone and sensation normal throughout.       Assessment & Plan:

## 2011-11-09 NOTE — Assessment & Plan Note (Signed)
Controlled, no change in medication  

## 2011-11-15 LAB — URINE CULTURE

## 2012-01-10 ENCOUNTER — Ambulatory Visit: Payer: BC Managed Care – PPO | Admitting: Family Medicine

## 2012-01-11 NOTE — Telephone Encounter (Signed)
Coded per doctor

## 2012-01-21 ENCOUNTER — Telehealth: Payer: Self-pay | Admitting: Family Medicine

## 2012-01-21 NOTE — Telephone Encounter (Signed)
Lab req printed and awaiting pickup

## 2012-02-05 ENCOUNTER — Encounter: Payer: Self-pay | Admitting: Family Medicine

## 2012-02-05 ENCOUNTER — Ambulatory Visit (INDEPENDENT_AMBULATORY_CARE_PROVIDER_SITE_OTHER): Payer: BC Managed Care – PPO | Admitting: Family Medicine

## 2012-02-05 VITALS — BP 122/62 | HR 62 | Resp 18 | Ht 64.5 in | Wt 190.1 lb

## 2012-02-05 DIAGNOSIS — E785 Hyperlipidemia, unspecified: Secondary | ICD-10-CM

## 2012-02-05 DIAGNOSIS — I10 Essential (primary) hypertension: Secondary | ICD-10-CM

## 2012-02-05 DIAGNOSIS — E669 Obesity, unspecified: Secondary | ICD-10-CM

## 2012-02-05 DIAGNOSIS — E119 Type 2 diabetes mellitus without complications: Secondary | ICD-10-CM

## 2012-02-05 DIAGNOSIS — M461 Sacroiliitis, not elsewhere classified: Secondary | ICD-10-CM

## 2012-02-05 MED ORDER — PREDNISONE (PAK) 5 MG PO TABS: 5.0000 mg | ORAL_TABLET | ORAL | Status: DC

## 2012-02-05 MED ORDER — KETOROLAC TROMETHAMINE 60 MG/2ML IJ SOLN
60.0000 mg | Freq: Once | INTRAMUSCULAR | Status: AC
  Administered 2012-02-05: 60 mg via INTRAMUSCULAR

## 2012-02-05 MED ORDER — IBUPROFEN 800 MG PO TABS: 800.0000 mg | ORAL_TABLET | Freq: Three times a day (TID) | ORAL | Status: AC | PRN

## 2012-02-05 MED ORDER — METHYLPREDNISOLONE ACETATE 80 MG/ML IJ SUSP
80.0000 mg | Freq: Once | INTRAMUSCULAR | Status: AC
  Administered 2012-02-05: 80 mg via INTRAMUSCULAR

## 2012-02-05 NOTE — Patient Instructions (Addendum)
F/u in January.  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  Congrats on retirement, all the best.  Remember to practice good foot care.  It is important that you exercise regularly at least 30 minutes 5 times a week. If you develop chest pain, have severe difficulty breathing, or feel very tired, stop exercising immediately and seek medical attention  A healthy diet is rich in fruit, vegetables and whole grains. Poultry fish, nuts and beans are a healthy choice for protein rather then red meat. A low sodium diet and drinking 64 ounces of water daily is generally recommended. Oils and sweet should be limited. Carbohydrates especially for those who are diabetic or overweight, should be limited to 34-45 gram per meal. It is important to eat on a regular schedule, at least 3 times daily. Snacks should be primarily fruits, vegetables or nuts.

## 2012-02-10 NOTE — Assessment & Plan Note (Signed)
Unchanged. Patient re-educated about  the importance of commitment to a  minimum of 150 minutes of exercise per week. The importance of healthy food choices with portion control discussed. Encouraged to start a food diary, count calories and to consider  joining a support group. Sample diet sheets offered. Goals set by the patient for the next several months.    

## 2012-02-10 NOTE — Assessment & Plan Note (Signed)
Hyperlipidemia:Low fat diet discussed and encouraged.  Follow up recent labs

## 2012-02-10 NOTE — Assessment & Plan Note (Signed)
Controlled, no change in medication DASH diet and commitment to daily physical activity for a minimum of 30 minutes discussed and encouraged, as a part of hypertension management. The importance of attaining a healthy weight is also discussed.  

## 2012-02-10 NOTE — Assessment & Plan Note (Signed)
Acute flare, aggressive course of anti inflammatories, if no relief , then refer to pain management

## 2012-02-10 NOTE — Progress Notes (Signed)
  Subjective:    Patient ID: [name], female    DOB: 1951/04/09, 61 y.o.   MRN: [id number]  HPI The PT is here for follow up and re-evaluation of chronic medical conditions, medication management and review of any available recent lab and radiology data.  Preventive health is updated, specifically  Cancer screening and Immunization.   Questions or concerns regarding consultations or procedures which the PT has had in the interim are  addressed. The PT denies any adverse reactions to current medications since the last visit.  C/o increased  localized pain and tenderness over slower back has ahd this in the past,no recent inciting trauma Denies symptoms of uncontrolled blood sugars, and they are within normal when tested Recently retired and is excited about this     Review of Systems See HPI Denies recent fever or chills. Denies sinus pressure, nasal congestion, ear pain or sore throat. Denies chest congestion, productive cough or wheezing. Denies chest pains, palpitations and leg swelling Denies abdominal pain, nausea, vomiting,diarrhea or constipation.   Denies dysuria, frequency, hesitancy or incontinence. Denies headaches, seizures, numbness, or tingling. Denies depression, anxiety or insomnia. Denies skin break down or rash.        Objective:   Physical Exam Patient alert and oriented and in no cardiopulmonary distress.  HEENT: No facial asymmetry, EOMI, no sinus tenderness,  oropharynx pink and moist.  Neck supple no adenopathy.  Chest: Clear to auscultation bilaterally.  CVS: S1, S2 no murmurs, no S3.  ABD: Soft non tender. Bowel sounds normal.  Ext: No edema  MS: Adequate ROM spine, shoulders, hips and knees.Tender over SI joints  Skin: Intact, no ulcerations or rash noted.  Psych: Good eye contact, normal affect. Memory intact not anxious or depressed appearing.  CNS: CN 2-12 intact, power, tone and sensation normal throughout.  Diabetic Foot Check:   Appearance - no lesions, ulcers or calluses Skin - no unusual pallor or redness Sensation - grossly intact to light touch Monofilament testing -  Right - Great toe, medial, central, lateral ball and posterior foot intact Left - Great toe, medial, central, lateral ball and posterior foot intact Pulses Left - Dorsalis Pedis and Posterior Tibia normal Right - Dorsalis Pedis and Posterior Tibia normal       Assessment & Plan:

## 2012-02-10 NOTE — Assessment & Plan Note (Signed)
Controlled, no change in medication  

## 2012-03-11 ENCOUNTER — Other Ambulatory Visit: Payer: Self-pay | Admitting: Family Medicine

## 2012-03-11 ENCOUNTER — Telehealth: Payer: Self-pay | Admitting: Family Medicine

## 2012-03-11 DIAGNOSIS — G8929 Other chronic pain: Secondary | ICD-10-CM

## 2012-03-11 DIAGNOSIS — M549 Dorsalgia, unspecified: Secondary | ICD-10-CM

## 2012-03-11 NOTE — Telephone Encounter (Signed)
Did you discuss this at last visit? Does she need a referral?

## 2012-03-11 NOTE — Telephone Encounter (Signed)
I have reviewed her record, and recommend evaluation and management with dr Eduard Clos, for low back pain, will enter the referral , please arrange and let her know

## 2012-03-11 NOTE — Telephone Encounter (Signed)
Pt was referred to dr. Eduard Clos office and she is aware

## 2012-03-16 ENCOUNTER — Other Ambulatory Visit: Payer: Self-pay | Admitting: Family Medicine

## 2012-04-29 ENCOUNTER — Other Ambulatory Visit: Payer: Self-pay | Admitting: Family Medicine

## 2012-05-24 ENCOUNTER — Other Ambulatory Visit: Payer: Self-pay | Admitting: Family Medicine

## 2012-06-09 ENCOUNTER — Encounter: Payer: Self-pay | Admitting: Family Medicine

## 2012-06-09 ENCOUNTER — Ambulatory Visit (INDEPENDENT_AMBULATORY_CARE_PROVIDER_SITE_OTHER): Payer: BC Managed Care – PPO | Admitting: Family Medicine

## 2012-06-09 VITALS — BP 130/76 | HR 75 | Resp 18 | Ht 64.5 in | Wt 191.1 lb

## 2012-06-09 DIAGNOSIS — R7309 Other abnormal glucose: Secondary | ICD-10-CM

## 2012-06-09 DIAGNOSIS — I10 Essential (primary) hypertension: Secondary | ICD-10-CM

## 2012-06-09 DIAGNOSIS — M25551 Pain in right hip: Secondary | ICD-10-CM | POA: Insufficient documentation

## 2012-06-09 DIAGNOSIS — E669 Obesity, unspecified: Secondary | ICD-10-CM

## 2012-06-09 DIAGNOSIS — Z309 Encounter for contraceptive management, unspecified: Secondary | ICD-10-CM

## 2012-06-09 DIAGNOSIS — M25559 Pain in unspecified hip: Secondary | ICD-10-CM

## 2012-06-09 DIAGNOSIS — E785 Hyperlipidemia, unspecified: Secondary | ICD-10-CM

## 2012-06-09 DIAGNOSIS — R7303 Prediabetes: Secondary | ICD-10-CM

## 2012-06-09 MED ORDER — METHYLPREDNISOLONE ACETATE 80 MG/ML IJ SUSP
80.0000 mg | Freq: Once | INTRAMUSCULAR | Status: AC
  Administered 2012-06-09: 80 mg via INTRAMUSCULAR

## 2012-06-09 MED ORDER — MEDROXYPROGESTERONE ACETATE 150 MG/ML IM SUSP
150.0000 mg | Freq: Once | INTRAMUSCULAR | Status: AC
  Administered 2012-06-09: 150 mg via INTRAMUSCULAR

## 2012-06-09 MED ORDER — MELOXICAM 15 MG PO TABS: 15.0000 mg | ORAL_TABLET | Freq: Every day | ORAL | Status: DC

## 2012-06-09 MED ORDER — KETOROLAC TROMETHAMINE 60 MG/2ML IJ SOLN
60.0000 mg | Freq: Once | INTRAMUSCULAR | Status: AC
  Administered 2012-06-09: 60 mg via INTRAMUSCULAR

## 2012-06-09 MED ORDER — PREDNISONE (PAK) 5 MG PO TABS: 5.0000 mg | ORAL_TABLET | ORAL | Status: DC

## 2012-06-09 NOTE — Progress Notes (Deleted)
Next Depo Provera due 3/31

## 2012-06-09 NOTE — Progress Notes (Signed)
  Subjective:    Patient ID: [name], female    DOB: 05-17-1951, 62 y.o.   MRN: [id number]  HPI The PT is here for follow up and re-evaluation of chronic medical conditions, medication management and review of any available recent lab and radiology data.  Preventive health is updated, specifically  Cancer screening and Immunization.   Questions or concerns regarding consultations or procedures which the PT has had in the interim are  addressed. The PT denies any adverse reactions to current medications since the last visit.  C/o uncontrolled right hip pain limiting mobility and negatively affecting quality of life x 3 weeks. No recent trauma to area      Review of Systems See HPI Denies recent fever or chills. Denies sinus pressure, nasal congestion, ear pain or sore throat. Denies chest congestion, productive cough or wheezing. Denies chest pains, palpitations and leg swelling Denies abdominal pain, nausea, vomiting,diarrhea or constipation.   Denies dysuria, frequency, hesitancy or incontinence. Denies headaches, seizures, numbness, or tingling. Denies depression, anxiety or insomnia. Denies skin break down or rash.        Objective:   Physical Exam  Patient alert and oriented and in no cardiopulmonary distress.Pt in pain  HEENT: No facial asymmetry, EOMI, no sinus tenderness,  oropharynx pink and moist.  Neck supple no adenopathy.  Chest: Clear to auscultation bilaterally.  CVS: S1, S2 no murmurs, no S3.  ABD: Soft non tender. Bowel sounds normal.  Ext: No edema  MS: Adequate ROM spine, shoulders, and knees.Reduced ROM right hip  Skin: Intact, no ulcerations or rash noted.  Psych: Good eye contact, normal affect. Memory intact not anxious or depressed appearing.  CNS: CN 2-12 intact, power, tone and sensation normal throughout.       Assessment & Plan:

## 2012-06-09 NOTE — Patient Instructions (Addendum)
F/u end April with rectal exam also  Toradol 60mg  and depo medrol 80mg  Im in office today for right hip  pain. Prednisone and meloxicam sent in also. Take meloxicam one daily for 1 week, then as needed for pain. Alternate with the tylenol arthritis when needed. Call if no better for referral back to Dr Eduard Clos for injection into the hip  You are no longer labeled diabetic but prediabetic.You no longer need or qualify for diabetic testing supplies   Please do not refill metformin, OK to take the remaining meds you have. Keep focussed on healthy food choice, physical activity and weight loss  To help with this.  CBC, fasting lipid, cmp , hBA1C, TSH and vit D end April.  Ensure you take calcium 1000 to 1200 mg daily with vit D 600IU total daily also aspirin 81 mg one daily to reduce stroke risk  One multivitamin one daily eg centrum is also recommended and may help with your fatigue

## 2012-06-29 NOTE — Assessment & Plan Note (Signed)
Controlled, no change in medication DASH diet and commitment to daily physical activity for a minimum of 30 minutes discussed and encouraged, as a part of hypertension management. The importance of attaining a healthy weight is also discussed.  

## 2012-06-29 NOTE — Assessment & Plan Note (Signed)
Unchanged. Patient re-educated about  the importance of commitment to a  minimum of 150 minutes of exercise per week. The importance of healthy food choices with portion control discussed. Encouraged to start a food diary, count calories and to consider  joining a support group. Sample diet sheets offered. Goals set by the patient for the next several months.    

## 2012-06-29 NOTE — Assessment & Plan Note (Signed)
Hyperlipidemia:Low fat diet discussed and encouraged.  Updated labs needed 

## 2012-06-29 NOTE — Assessment & Plan Note (Signed)
Acute and uncontrolled, anti inflammatories in office followed by oral course if persists refer for injection into joint

## 2012-06-29 NOTE — Assessment & Plan Note (Signed)
Dietary management only, medication discontinued

## 2012-09-09 ENCOUNTER — Encounter: Payer: Self-pay | Admitting: *Deleted

## 2012-09-10 ENCOUNTER — Ambulatory Visit (INDEPENDENT_AMBULATORY_CARE_PROVIDER_SITE_OTHER): Payer: BC Managed Care – PPO | Admitting: *Deleted

## 2012-09-10 DIAGNOSIS — I781 Nevus, non-neoplastic: Secondary | ICD-10-CM

## 2012-09-10 NOTE — Progress Notes (Signed)
X=.3% Sotradecol administered with a 27g butterfly.  Patient received a total of 12cc foam.  Treated the left leg which has clear skin now that she went to Dr. Yetta Barre. Treated some new areas on the right leg too. Easy access. Tol well. Anticipate good results though she will need more sclero in the future. Follow this nice lady prn.  Photos: yes  Compression stockings applied: yes

## 2012-09-30 ENCOUNTER — Ambulatory Visit: Payer: BC Managed Care – PPO | Admitting: Family Medicine

## 2013-01-01 ENCOUNTER — Other Ambulatory Visit: Payer: Self-pay

## 2013-02-05 ENCOUNTER — Other Ambulatory Visit (HOSPITAL_COMMUNITY): Payer: Self-pay | Admitting: Internal Medicine

## 2013-02-05 DIAGNOSIS — M545 Low back pain, unspecified: Secondary | ICD-10-CM

## 2013-02-05 DIAGNOSIS — Z139 Encounter for screening, unspecified: Secondary | ICD-10-CM

## 2013-02-10 ENCOUNTER — Ambulatory Visit (HOSPITAL_COMMUNITY)
Admission: RE | Admit: 2013-02-10 | Discharge: 2013-02-10 | Disposition: A | Discharge status: Home / Self Care | Payer: BC Managed Care – PPO | Source: Ambulatory Visit | Attending: Internal Medicine | Admitting: Internal Medicine

## 2013-02-10 DIAGNOSIS — M25559 Pain in unspecified hip: Secondary | ICD-10-CM | POA: Insufficient documentation

## 2013-02-10 DIAGNOSIS — M545 Low back pain, unspecified: Secondary | ICD-10-CM

## 2013-02-10 DIAGNOSIS — Z139 Encounter for screening, unspecified: Secondary | ICD-10-CM

## 2013-02-10 DIAGNOSIS — M51379 Other intervertebral disc degeneration, lumbosacral region without mention of lumbar back pain or lower extremity pain: Secondary | ICD-10-CM | POA: Insufficient documentation

## 2013-02-10 DIAGNOSIS — M5137 Other intervertebral disc degeneration, lumbosacral region: Secondary | ICD-10-CM | POA: Insufficient documentation

## 2013-02-10 DIAGNOSIS — Z1231 Encounter for screening mammogram for malignant neoplasm of breast: Secondary | ICD-10-CM | POA: Insufficient documentation

## 2013-02-19 ENCOUNTER — Encounter (HOSPITAL_COMMUNITY): Payer: Self-pay | Admitting: *Deleted

## 2013-02-19 ENCOUNTER — Emergency Department (HOSPITAL_COMMUNITY)
Admission: EM | Admit: 2013-02-19 | Discharge: 2013-02-19 | Disposition: A | Discharge status: Home / Self Care | Payer: BC Managed Care – PPO | Attending: Emergency Medicine | Admitting: Emergency Medicine

## 2013-02-19 DIAGNOSIS — Z79899 Other long term (current) drug therapy: Secondary | ICD-10-CM | POA: Insufficient documentation

## 2013-02-19 DIAGNOSIS — Z872 Personal history of diseases of the skin and subcutaneous tissue: Secondary | ICD-10-CM | POA: Insufficient documentation

## 2013-02-19 DIAGNOSIS — R059 Cough, unspecified: Secondary | ICD-10-CM | POA: Insufficient documentation

## 2013-02-19 DIAGNOSIS — E785 Hyperlipidemia, unspecified: Secondary | ICD-10-CM | POA: Insufficient documentation

## 2013-02-19 DIAGNOSIS — J3489 Other specified disorders of nose and nasal sinuses: Secondary | ICD-10-CM | POA: Insufficient documentation

## 2013-02-19 DIAGNOSIS — I10 Essential (primary) hypertension: Secondary | ICD-10-CM | POA: Insufficient documentation

## 2013-02-19 DIAGNOSIS — R05 Cough: Secondary | ICD-10-CM | POA: Insufficient documentation

## 2013-02-19 DIAGNOSIS — E119 Type 2 diabetes mellitus without complications: Secondary | ICD-10-CM | POA: Insufficient documentation

## 2013-02-19 DIAGNOSIS — L509 Urticaria, unspecified: Secondary | ICD-10-CM | POA: Insufficient documentation

## 2013-02-19 DIAGNOSIS — E669 Obesity, unspecified: Secondary | ICD-10-CM | POA: Insufficient documentation

## 2013-02-19 MED ORDER — PREDNISONE 50 MG PO TABS
60.0000 mg | ORAL_TABLET | Freq: Once | ORAL | Status: AC
  Administered 2013-02-19: 60 mg via ORAL
  Filled 2013-02-19: qty 1

## 2013-02-19 MED ORDER — FAMOTIDINE 20 MG PO TABS
20.0000 mg | ORAL_TABLET | Freq: Once | ORAL | Status: AC
  Administered 2013-02-19: 20 mg via ORAL
  Filled 2013-02-19: qty 1

## 2013-02-19 MED ORDER — HYDROXYZINE PAMOATE 25 MG PO CAPS: ORAL_CAPSULE | ORAL | Status: DC

## 2013-02-19 MED ORDER — HYDROXYZINE HCL 25 MG PO TABS
25.0000 mg | ORAL_TABLET | Freq: Once | ORAL | Status: AC
  Administered 2013-02-19: 25 mg via ORAL
  Filled 2013-02-19: qty 1

## 2013-02-19 MED ORDER — PREDNISONE 10 MG PO TABS: ORAL_TABLET | ORAL | Status: DC

## 2013-02-19 MED ORDER — CETIRIZINE HCL 10 MG PO TABS: 10.0000 mg | ORAL_TABLET | Freq: Every day | ORAL | Status: DC

## 2013-02-19 NOTE — ED Notes (Signed)
Pt states rash began yesterday along with productive cough, clear in color.

## 2013-02-19 NOTE — ED Provider Notes (Signed)
Medical screening examination/treatment/procedure(s) were performed by non-physician practitioner and as supervising physician I was immediately available for consultation/collaboration.   Candyce Churn, MD 02/19/13 1630

## 2013-02-19 NOTE — ED Provider Notes (Signed)
CSN: [id number]     Arrival date & time 02/19/13  0809 History   First MD Initiated Contact with Patient 02/19/13 [phone]     Chief Complaint  Patient presents with  . Rash  . Cough   (Consider location/radiation/quality/duration/timing/severity/associated sxs/prior Treatment) Patient is a 62 y.o. female presenting with rash and cough. The history is provided by the patient.  Rash Location:  Face, torso, leg and shoulder/arm Facial rash location:  Face and forehead Shoulder/arm rash location:  L shoulder and R shoulder Torso rash location:  Upper back Leg rash location:  R upper leg Quality: itchiness and redness   Quality: not painful, not scaling and not weeping   Severity:  Moderate Onset quality:  Gradual Duration:  10 hours Timing:  Intermittent Progression:  Worsening Chronicity:  New Context comment:  Unknown Relieved by:  Nothing Ineffective treatments:  Antihistamines Associated symptoms: no abdominal pain, no diarrhea, no fever, no joint pain, no nausea, no shortness of breath, no sore throat and not wheezing   Associated symptoms comment:  Cough, sneezing, runny nose Cough Associated symptoms: rash   Associated symptoms: no chest pain, no eye discharge, no fever, no shortness of breath, no sore throat and no wheezing     Past Medical History  Diagnosis Date  . IGT (impaired glucose tolerance)   . Dermatitis   . Obesity   . Hyperlipidemia   . Hypertension   . Diabetes mellitus    Past Surgical History  Procedure Laterality Date  . Right breast biopsy calcification  benign  2003  . Tubal ligation     Family History  Problem Relation Age of Onset  . Addison's disease Mother   . Diabetes Mother   . Heart disease Mother   . Stroke Father   . Heart attack Father   . Heart disease Father   . Irregular heart beat Sister   . Hypertension Sister   . Heart disease Sister   . Diabetes Sister   . Hypertension Sister   . Coronary artery disease Brother   .  Heart disease Brother    History  Substance Use Topics  . Smoking status: Never Smoker   . Smokeless tobacco: Not on file  . Alcohol Use: No   OB History   Grav Para Term Preterm Abortions TAB SAB Ect Mult Living                 Review of Systems  Constitutional: Negative for fever and activity change.       All ROS Neg except as noted in HPI  HENT: Negative for nosebleeds, sore throat and neck pain.   Eyes: Negative for photophobia and discharge.  Respiratory: Positive for cough. Negative for shortness of breath and wheezing.   Cardiovascular: Negative for chest pain and palpitations.  Gastrointestinal: Negative for nausea, abdominal pain, diarrhea and blood in stool.  Genitourinary: Negative for dysuria, frequency and hematuria.  Musculoskeletal: Negative for back pain and arthralgias.  Skin: Positive for rash.  Neurological: Negative for dizziness, seizures and speech difficulty.  Psychiatric/Behavioral: Negative for hallucinations and confusion.    Allergies  Codeine and Sulfonamide derivatives  Home Medications   Current Outpatient Rx  Name  Route  Sig  Dispense  Refill  . atenolol (TENORMIN) 25 MG tablet      take 1 tablet by mouth once daily   30 tablet   5   . citalopram (CELEXA) 20 MG tablet      take 1 tablet by  mouth twice a day   60 tablet   5   . lisinopril-hydrochlorothiazide (PRINZIDE,ZESTORETIC) 10-12.5 MG per tablet      take 1 tablet by mouth once daily   30 tablet   5   . meloxicam (MOBIC) 15 MG tablet   Oral   Take 1 tablet (15 mg total) by mouth daily.   30 tablet   2   . EXPIRED: pravastatin (PRAVACHOL) 40 MG tablet   Oral   Take 1 tablet (40 mg total) by mouth every evening.   30 tablet   5     Discontinue lovastatin effective 10/03/2010   . predniSONE (STERAPRED UNI-PAK) 5 MG TABS   Oral   Take 1 tablet (5 mg total) by mouth as directed.   21 tablet   0    BP 167/92  Pulse 88  Temp(Src) 98 F (36.7 C) (Oral)  Resp 16   SpO2 97% Physical Exam  Nursing note and vitals reviewed. Constitutional: She is oriented to person, place, and time. She appears well-developed and well-nourished.  Non-toxic appearance.  HENT:  Head: Normocephalic.  Right Ear: Tympanic membrane and external ear normal.  Left Ear: Tympanic membrane and external ear normal.  Eyes: EOM and lids are normal. Pupils are equal, round, and reactive to light.  Neck: Normal range of motion. Neck supple. Carotid bruit is not present.  Cardiovascular: Normal rate, regular rhythm, normal heart sounds, intact distal pulses and normal pulses.   Pulmonary/Chest: Breath sounds normal. No respiratory distress.  Abdominal: Soft. Bowel sounds are normal. There is no tenderness. There is no guarding.  Musculoskeletal: Normal range of motion.  Lymphadenopathy:       Head (right side): No submandibular adenopathy present.       Head (left side): No submandibular adenopathy present.    She has no cervical adenopathy.  Neurological: She is alert and oriented to person, place, and time. She has normal strength. No cranial nerve deficit or sensory deficit.  Skin: Skin is warm and dry.  Hives noted on shoulders, right and left mid flank areas, upper back, upper thighes, and around the forehead, behind the right ear, few in scalp, and face.  Psychiatric: She has a normal mood and affect. Her speech is normal.    ED Course  Procedures (including critical care time) Labs Review Labs Reviewed - No data to display Imaging Review No results found.  MDM  No diagnosis found. **I have reviewed nursing notes, vital signs, and all appropriate lab and imaging results for this patient.  The hives at multiple areas greatly improved after vistaril and pepcid. No new hives. No respiratory problems during observation in ED. Plan - Rx zrytec, prednisone and vistaril at HS. Pt to see allergist if not improving. She will return to ED if any emergency changes or  problem.  Kathie Dike, PA-C 02/19/13 1053

## 2013-03-23 ENCOUNTER — Other Ambulatory Visit: Payer: Self-pay | Admitting: Family Medicine

## 2013-03-30 ENCOUNTER — Ambulatory Visit (HOSPITAL_COMMUNITY)
Admission: RE | Admit: 2013-03-30 | Discharge: 2013-03-30 | Disposition: A | Discharge status: Home / Self Care | Payer: BC Managed Care – PPO | Source: Ambulatory Visit | Attending: Neurosurgery | Admitting: Neurosurgery

## 2013-03-30 DIAGNOSIS — M545 Low back pain, unspecified: Secondary | ICD-10-CM | POA: Insufficient documentation

## 2013-03-30 DIAGNOSIS — IMO0001 Reserved for inherently not codable concepts without codable children: Secondary | ICD-10-CM | POA: Insufficient documentation

## 2013-03-30 DIAGNOSIS — M7918 Myalgia, other site: Secondary | ICD-10-CM | POA: Insufficient documentation

## 2013-03-30 DIAGNOSIS — M5380 Other specified dorsopathies, site unspecified: Secondary | ICD-10-CM | POA: Insufficient documentation

## 2013-03-30 DIAGNOSIS — E119 Type 2 diabetes mellitus without complications: Secondary | ICD-10-CM | POA: Insufficient documentation

## 2013-03-30 DIAGNOSIS — I10 Essential (primary) hypertension: Secondary | ICD-10-CM | POA: Insufficient documentation

## 2013-03-30 NOTE — Evaluation (Signed)
Physical Therapy Evaluation  Patient Details  Name: [name] MRN: [id number] Date of Birth: 08-02-1950  Today's Date: 03/30/2013 Time: 0900-0930 PT Time Calculation (min): 30 min Charges: 1 eval             Visit#: 1 of 8  Re-eval: 04/29/13 Assessment Diagnosis: low back pain and sciatica Next MD Visit: Dr. Wynetta Emery - Nov 13th  Past Medical History:  Past Medical History  Diagnosis Date  . IGT (impaired glucose tolerance)   . Dermatitis   . Obesity   . Hyperlipidemia   . Hypertension   . Diabetes mellitus    Past Surgical History:  Past Surgical History  Procedure Laterality Date  . Right breast biopsy calcification  benign  2003  . Tubal ligation      Subjective Symptoms/Limitations Symptoms: Pt is a 62 year old female referred to PT for low back pain which started about 6 months ago.  Her c/co is pain when weakness to her low back when lifting (i.e, her grandchildren), pain in the early morning (about 4am) when she is laying down and describes it as throbbing in nature, also increased pain with a "catch" sensation with rolling over.  She was given pain medication and had injections to her back which helped for a short period of time.  She f/u with Dr. Fenton Foy who also proceeded with injections to the back with releif for a short peroid of time.  She was then sent to Dr. Wynetta Emery and at that time was having pain with sitting and standing, denies pain in these positions now.  She has had an x-ray and MRI which showed lumbar stenosis.  She reports that she is having difficulty sleeping due to the pain. also is having pain and stiffness to her rt knee.  How long can you sit comfortably?: no difficulty How long can you stand comfortably?: no difficulty How long can you walk comfortably?: no difficulty Patient Stated Goals: be able to sleep better with less pain.  Pain Assessment Currently in Pain?: No/denies Pain Score: 8  (at night time. ) Pain Location: Back Pain Relieving  Factors: Tyelonol arthritis Effect of Pain on Daily Activities: difficulty sleeping through the night.   Balance Screening Balance Screen Has the patient fallen in the past 6 months: No Has the patient had a decrease in activity level because of a fear of falling? : No Is the patient reluctant to leave their home because of a fear of falling? : No  Cognition/Observation Observation/Other Assessments Observations: mild Rt sacrolilac (SI) anterior rotation Other Assessments: increased gluteal muscle tone in standing and prone position, able to decrease muscle tone with mod VC and education.   Sensation/Coordination/Flexibility/Functional Tests Coordination Gross Motor Movements are Fluid and Coordinated: No Coordination and Movement Description: impaired to transverse abdominus (TrA ) and multifidus  Flexibility Thomas: Positive 90/90: Positive Functional Tests Functional Tests: + FABER Functional Tests: - SLR, - ASLR Functional Tests: FOTO: Status: 66%, Limitaiton 34%  Assessment RLE Strength Right Hip Flexion: 5/5 Right Hip Extension: 3+/5 Right Hip ABduction: 4/5 Right Hip ADduction: 5/5 LLE Strength Left Hip Flexion: 5/5 Left Hip Extension: 5/5 Left Hip ABduction: 5/5 Left Hip ADduction: 5/5 Lumbar Assessment Lumbar Assessment: Within Functional Limits Palpation Palpation: significant muscle spasms with pain to Rt piriformis, gluteus medius, gluteus maximus, over ischial tubricle and deep hip internal/external rotators.   Mobility/Balance  Ambulation/Gait Ambulation/Gait: Yes Gait Pattern: Lateral hip instability;Antalgic Posture/Postural Control Posture/Postural Control: Postural limitations Postural Limitations: upper and lower cross  syndrome   Exercise/Treatments Stretches Active Hamstring Stretch: 1 rep;60 seconds Piriformis Stretch: 1 rep;60 seconds;Other (comment) (supine knee to opp shoulder)  Manual Therapy Manual Therapy: Other (comment) Other  Manual Therapy: strain counter strain (SCS) to Rt piriformis and deep hip internal hip musculature w/100% release  Physical Therapy Assessment and Plan PT Assessment and Plan Clinical Impression Statement: Pt is a 62 year old female referred to PT for low back pain with impairments listed below.  At this time with her hx stating that her pain is only when she is laying supine and has a "catch" sensation with rolling (denies pain with sitting, standing or walking) and upon palpation has significant muscle spasms to her Rt gluteal region feel that she has musculoskeltal pain vs. lumbar mechanical pain at this time. Pt responded well to manual techniques and did not have any pain when laying supine or rolling after manual.  Pt will benefit from skilled therapeutic intervention in order to improve on the following deficits: Pain;Impaired tone;Improper spinal/pelvic alignment;Increased muscle spasms;Decreased strength;Decreased coordination;Improper body mechanics PT Frequency: Min 2X/week PT Duration: 4 weeks PT Treatment/Interventions: Therapeutic activities;Therapeutic exercise;Functional mobility training;Stair training;Gait training;Neuromuscular re-education;Patient/family education;Balance training;Manual techniques PT Plan: MET for SI alignment if needed, manual techniques to Rt gluteal musculature, go over HEP: piriformis, hamstring, hip internal rotator stretching, educate on proper sitting and standing posture, importance of decreaseing gluteal tone while standing and encourage TrA.  Begin core stabilization exercises and progress by 4th visit to therabands, squats, side lunges, heel/toe raises. Educate on proper lifting techniques.     Goals Home Exercise Program Pt/caregiver will Perform Home Exercise Program: Independently PT Goal: Perform Home Exercise Program - Progress: Goal set today PT Short Term Goals Time to Complete Short Term Goals: 2 weeks PT Short Term Goal 1: Pt will present  with minimal muscle spasms and fascial restrictions to Rt gluteal region to report pain at night less than 3/10. PT Short Term Goal 2: Pt will present with normalized SI function for decreased gluteal pain.  PT Short Term Goal 3: Pt will be educated on proper posture and postural exercises for a maintance program in order to decrease secondary impairments.  PT Long Term Goals Time to Complete Long Term Goals: 4 weeks PT Long Term Goal 1: Pt will improve her FOTO to Status greater than 69% and limiation less than 31% for improved percieved functional ability.  PT Long Term Goal 2: Pt will be educated on a proper lifting techniques to decrease risk of secondary injury when lifiting her grandchildren.  Long Term Goal 3: Pt will present without muscle spasms to Rt gluteal region in order to report uninteruppted sleep due to gluteal pain.   Problem List Patient Active Problem List   Diagnosis Date Noted  . Lumbago 03/30/2013  . Gluteal pain 03/30/2013  . Hip pain, right 06/09/2012  . Cystitis, acute 11/09/2011  . Hip pain 10/23/2011  . SI (sacroiliac) joint inflammation 10/23/2011  . Depressive disorder, not elsewhere classified 10/08/2010  . Prediabetes 08/16/2009  . FATIGUE 03/10/2009  . VARICOSE VEINS LOWER EXTREMITIES W/OTH COMPS 12/09/2008  . HYPERLIPIDEMIA 11/27/2007  . OBESITY 11/27/2007  . HYPERTENSION 11/27/2007  . DERMATITIS 11/27/2007    PT - End of Session Activity Tolerance: Patient tolerated treatment well PT Plan of Care PT Home Exercise Plan: given PT Patient Instructions: importance of hydration, posture and role of PT. Consulted and Agree with Plan of Care: Patient  GP    Laine Fonner, MPT, ATC 03/30/2013, 10:04 AM  Physician Documentation Your signature is required to indicate approval of the treatment plan as stated above.  Please sign and either send electronically or make a copy of this report for your files and return this physician signed original.    Please mark one 1.__approve of plan  2. ___approve of plan with the following conditions.   ______________________________                                                          _____________________ Physician Signature                                                                                                             Date

## 2013-04-02 ENCOUNTER — Ambulatory Visit (HOSPITAL_COMMUNITY)
Admission: RE | Admit: 2013-04-02 | Discharge: 2013-04-02 | Disposition: A | Discharge status: Home / Self Care | Payer: BC Managed Care – PPO | Source: Ambulatory Visit | Attending: Family Medicine | Admitting: Family Medicine

## 2013-04-02 DIAGNOSIS — M545 Low back pain, unspecified: Secondary | ICD-10-CM

## 2013-04-02 DIAGNOSIS — M7918 Myalgia, other site: Secondary | ICD-10-CM

## 2013-04-02 NOTE — Progress Notes (Signed)
Physical Therapy Treatment Patient Details  Name: [name] MRN: [id number] Date of Birth: Oct 20, 1950  Today's Date: 04/02/2013 Time: 2130-8657 PT Time Calculation (min): 44 min Charge: Manual [id number], TE [id number]  Visit#: 2 of 8  Re-eval: 04/29/13 Assessment Diagnosis: low back pain and sciatica Next MD Visit: Dr. Wynetta Emery - Nov 13th  Subjective: Symptoms/Limitations Symptoms: Pt reported no real pain just little discomfort in Rt gluteal muscule muscle.  Pt reports compliance with HEP able to do verbalize appropriate time with stretches Pain Assessment Currently in Pain?: Yes Pain Score: 1  Pain Location: Buttocks Pain Orientation: Right  Objective:   Exercise/Treatments Stretches Active Hamstring Stretch: 3 reps;30 seconds;Limitations Active Hamstring Stretch Limitations: 1 rep standard, 2 with rope ITB Stretch: 3 reps;30 seconds;Limitations ITB Stretch Limitations: rope Piriformis Stretch: 3 reps;30 seconds (supine knee to opposite shoulder) Supine Ab Set: 5 reps;Limitations AB Set Limitations: 5 reps 10" holds with vc/tc-ing  Manual Therapy Manual Therapy: Other (comment) Other Manual Therapy: SI within alignment, Strain counter strain (SCS) to Rt piriformis w/100% release  Physical Therapy Assessment and Plan PT Assessment and Plan Clinical Impression Statement: SI within alignment, no MET required.  Manual techniques complete to reduce tightness/spasms and reduce pain, able to resolve 100%.  Pt educated on importance of core activation and relaxation of gluteal region.  Ptt able to demonstrate independent TrA contraction following verbal and tactile cueing.  Pt reported pain resolved at end of sessoin.   PT Plan: MET for SI alignment if needed, manual techniques to Rt gluteal musculature, go over HEP: piriformis, hamstring, hip internal rotator stretching, educate on proper sitting and standing posture, importance of decreaseing gluteal tone while standing and  encourage TrA.  Begin core stabilization exercises and progress by 4th visit to therabands, squats, side lunges, heel/toe raises. Educate on proper lifting techniques.     Goals Home Exercise Program Pt/caregiver will Perform Home Exercise Program: Independently PT Short Term Goals Time to Complete Short Term Goals: 2 weeks PT Short Term Goal 1: Pt will present with minimal muscle spasms and fascial restrictions to Rt gluteal region to report pain at night less than 3/10. PT Short Term Goal 1 - Progress: Progressing toward goal PT Short Term Goal 2: Pt will present with normalized SI function for decreased gluteal pain.  PT Short Term Goal 2 - Progress: Progressing toward goal PT Short Term Goal 3: Pt will be educated on proper posture and postural exercises for a maintance program in order to decrease secondary impairments.  PT Short Term Goal 3 - Progress: Progressing toward goal PT Long Term Goals Time to Complete Long Term Goals: 4 weeks PT Long Term Goal 1: Pt will improve her FOTO to Status greater than 69% and limiation less than 31% for improved percieved functional ability.  PT Long Term Goal 2: Pt will be educated on a proper lifting techniques to decrease risk of secondary injury when lifiting her grandchildren.  Long Term Goal 3: Pt will present without muscle spasms to Rt gluteal region in order to report uninteruppted sleep due to gluteal pain.   Problem List Patient Active Problem List   Diagnosis Date Noted  . Lumbago 03/30/2013  . Gluteal pain 03/30/2013  . Hip pain, right 06/09/2012  . Cystitis, acute 11/09/2011  . Hip pain 10/23/2011  . SI (sacroiliac) joint inflammation 10/23/2011  . Depressive disorder, not elsewhere classified 10/08/2010  . Prediabetes 08/16/2009  . FATIGUE 03/10/2009  . VARICOSE VEINS LOWER EXTREMITIES W/OTH COMPS 12/09/2008  . HYPERLIPIDEMIA  11/27/2007  . OBESITY 11/27/2007  . HYPERTENSION 11/27/2007  . DERMATITIS 11/27/2007    PT - End  of Session Activity Tolerance: Patient tolerated treatment well  GP    Juel Burrow 04/02/2013, 9:54 AM

## 2013-04-06 ENCOUNTER — Ambulatory Visit (HOSPITAL_COMMUNITY): Payer: BC Managed Care – PPO | Admitting: Physical Therapy

## 2013-04-06 ENCOUNTER — Telehealth (HOSPITAL_COMMUNITY): Payer: Self-pay

## 2013-04-09 ENCOUNTER — Ambulatory Visit (HOSPITAL_COMMUNITY)
Admission: RE | Admit: 2013-04-09 | Discharge: 2013-04-09 | Disposition: A | Discharge status: Home / Self Care | Payer: BC Managed Care – PPO | Source: Ambulatory Visit | Attending: Neurosurgery | Admitting: Neurosurgery

## 2013-04-09 DIAGNOSIS — M545 Low back pain, unspecified: Secondary | ICD-10-CM | POA: Insufficient documentation

## 2013-04-09 DIAGNOSIS — E119 Type 2 diabetes mellitus without complications: Secondary | ICD-10-CM | POA: Insufficient documentation

## 2013-04-09 DIAGNOSIS — M5380 Other specified dorsopathies, site unspecified: Secondary | ICD-10-CM | POA: Insufficient documentation

## 2013-04-09 DIAGNOSIS — IMO0001 Reserved for inherently not codable concepts without codable children: Secondary | ICD-10-CM | POA: Insufficient documentation

## 2013-04-09 DIAGNOSIS — I10 Essential (primary) hypertension: Secondary | ICD-10-CM | POA: Insufficient documentation

## 2013-04-09 NOTE — Progress Notes (Signed)
Physical Therapy Treatment Patient Details  Name: [name] MRN: [id number] Date of Birth: [birth year]  Today's Date: 04/09/2013 Time: 6578-4696 PT Time Calculation (min): 42 min Charges:  therex 40' Visit#: 3 of 8  Re-eval: 04/29/13   Subjective: Symptoms/Limitations Symptoms: Pt reports no pain today.  Pt states her Rt knee has been bothering her since she was on her knees in the floor at home, cleaning. Pain Assessment Currently in Pain?: No/denies   Exercise/Treatments Stretches Active Hamstring Stretch: 3 reps;30 seconds;Limitations Active Hamstring Stretch Limitations: with rope ITB Stretch: 3 reps;30 seconds;Limitations ITB Stretch Limitations: rope Piriformis Stretch: 3 reps;30 seconds;Limitations Piriformis Stretch Limitations: figure 4 in supine Aerobic Stationary Bike: nustep 8' level 2 hills #2 at end of session Standing Scapular Retraction: 10 reps;Theraband Theraband Level (Scapular Retraction): Level 3 (Green) Row: 10 reps;Theraband Theraband Level (Row): Level 3 (Green) Shoulder Extension: 10 reps;Theraband Theraband Level (Shoulder Extension): Level 3 (Green) Supine Bridge: 5 reps;Limitations Bridge Limitations: Rt single LE only Prone  Single Arm Raise: 5 reps Straight Leg Raise: 5 reps Other Prone Lumbar Exercises: heelsqueezes 10X5" holds    Physical Therapy Assessment and Plan PT Assessment and Plan Clinical Impression Statement: SI continues to stay within alignment.  Pain more in hip joint area and only at night; no pain with walking/sitting/moving.  Progressed with theraband postural exericises and prone stab exercises.  pt with difficulty completing theraband using correct form.  Added nustep at end of session to help loosen stiff joints/increase LE strength.   PT Plan: MET for SI alignment if needed, manual techniques to Rt gluteal musculature, go over HEP: piriformis, hamstring, hip internal rotator stretching, educate on proper sitting  and standing posture, importance of decreaseing gluteal tone while standing and encourage TrA.  Begin core stabilization exercises and progress by 4th visit to therabands, squats, side lunges, heel/toe raises. Educate on proper lifting techniques.      Problem List Patient Active Problem List   Diagnosis Date Noted  . Lumbago 03/30/2013  . Gluteal pain 03/30/2013  . Hip pain, right 06/09/2012  . Cystitis, acute 11/09/2011  . Hip pain 10/23/2011  . SI (sacroiliac) joint inflammation 10/23/2011  . Depressive disorder, not elsewhere classified 10/08/2010  . Prediabetes 08/16/2009  . FATIGUE 03/10/2009  . VARICOSE VEINS LOWER EXTREMITIES W/OTH COMPS 12/09/2008  . HYPERLIPIDEMIA 11/27/2007  . OBESITY 11/27/2007  . HYPERTENSION 11/27/2007  . DERMATITIS 11/27/2007    PT - End of Session Activity Tolerance: Patient tolerated treatment well   Lurena Nida, PTA/CLT 04/09/2013, 10:19 AM

## 2013-04-13 ENCOUNTER — Ambulatory Visit (HOSPITAL_COMMUNITY)
Admission: RE | Admit: 2013-04-13 | Discharge: 2013-04-13 | Disposition: A | Discharge status: Home / Self Care | Payer: BC Managed Care – PPO | Source: Ambulatory Visit | Attending: Family Medicine | Admitting: Family Medicine

## 2013-04-13 NOTE — Progress Notes (Signed)
Physical Therapy Treatment Patient Details  Name: [name] MRN: [id number] Date of Birth: [birth year]  Today's Date: 04/13/2013 Time: 0932-1015 PT Time Calculation (min): 43 min Visit#: 4 of 8  Re-eval: 04/29/13 Charges:  therex 40'  Subjective: Symptoms/Limitations Symptoms: Pt reported she has indegestion today; pain up into her chest.  BP taken at 120/70 from Rt UE.  Pt denies any heart history.  States currently with discomfort into Rt hip, however no pain or lumbar pain.   Pain Assessment Currently in Pain?: No/denies   Exercise/Treatments Stretches Active Hamstring Stretch: 3 reps;30 seconds;Limitations Active Hamstring Stretch Limitations: with rope ITB Stretch: 3 reps;30 seconds;Limitations ITB Stretch Limitations: rope Piriformis Stretch: 3 reps;30 seconds;Limitations Piriformis Stretch Limitations: figure 4 in supine Aerobic Stationary Bike: nustep 8' level 2 hills #2 at end of session Standing Scapular Retraction: 10 reps;Theraband Theraband Level (Scapular Retraction): Level 3 (Green) Row: 10 reps;Theraband Theraband Level (Row): Level 3 (Green) Shoulder Extension: 10 reps;Theraband Theraband Level (Shoulder Extension): Level 3 (Green) Supine Bridge: 10 reps;Limitations Bridge Limitations: Rt single LE only Prone  Single Arm Raise: 5 reps Straight Leg Raise: 5 reps Opposite Arm/Leg Raise: 5 reps Other Prone Lumbar Exercises: heelsqueezes 10X5" holds   Physical Therapy Assessment and Plan PT Assessment and Plan Clinical Impression Statement: Much improved form today with theraband exercises; pt reports compliance with HEP.  Added prone opposite UE/LE today.  Overall improving without lumbar pain. PT Plan: Add squats, side lunges, heel/toe raises. Educate on proper lifting techniques.      Problem List Patient Active Problem List   Diagnosis Date Noted  . Lumbago 03/30/2013  . Gluteal pain 03/30/2013  . Hip pain, right 06/09/2012  . Cystitis,  acute 11/09/2011  . Hip pain 10/23/2011  . SI (sacroiliac) joint inflammation 10/23/2011  . Depressive disorder, not elsewhere classified 10/08/2010  . Prediabetes 08/16/2009  . FATIGUE 03/10/2009  . VARICOSE VEINS LOWER EXTREMITIES W/OTH COMPS 12/09/2008  . HYPERLIPIDEMIA 11/27/2007  . OBESITY 11/27/2007  . HYPERTENSION 11/27/2007  . DERMATITIS 11/27/2007    PT - End of Session Activity Tolerance: Patient tolerated treatment well    Lurena Nida, PTA/CLT 04/13/2013, 10:11 AM

## 2013-04-16 ENCOUNTER — Ambulatory Visit (HOSPITAL_COMMUNITY)
Admission: RE | Admit: 2013-04-16 | Discharge: 2013-04-16 | Disposition: A | Discharge status: Home / Self Care | Payer: BC Managed Care – PPO | Source: Ambulatory Visit | Attending: Family Medicine | Admitting: Family Medicine

## 2013-04-16 DIAGNOSIS — M7918 Myalgia, other site: Secondary | ICD-10-CM

## 2013-04-16 DIAGNOSIS — M545 Low back pain, unspecified: Secondary | ICD-10-CM

## 2013-04-16 NOTE — Progress Notes (Signed)
Physical Therapy Treatment Patient Details  Name: [name] MRN: [id number] Date of Birth: Oct 09, 1950  Today's Date: 04/16/2013 Time: 7829-5621 PT Time Calculation (min): 39 min Charges: TE: 53' Visit#: 5 of 8  Re-eval: 04/29/13 Assessment Diagnosis: low back pain and sciatica Next MD Visit: Dr. Wynetta Emery - Dec 9th  Subjective: Symptoms/Limitations Symptoms: Reports she is sore today.  Did not sleep well last night, feels she has a "catch" in her leg.  Pain Assessment Currently in Pain?: Yes Pain Score: 2  Pain Location: Hip Pain Orientation: Right  Precautions/Restrictions     Exercise/Treatments Mobility/Balance        Stretches Piriformis Stretch: 1 rep;60 seconds Piriformis Stretch Limitations: figure 4 in seated Aerobic Stationary Bike: nustep 10' level 2 hills #3 SPM avg: for core strengthening Standing Heel Raises: 10 reps Functional Squats: 10 reps;Limitations Functional Squats Limitations: PT facilation Side Lunge: 10 reps;Limitations Side Lunge Limitations: BLE with PT facilation Scapular Retraction: 10 reps;Theraband Theraband Level (Scapular Retraction): Level 4 (Blue) Row: Theraband;15 reps Theraband Level (Row): Level 4 (Blue) Shoulder Extension: Theraband;15 reps Theraband Level (Shoulder Extension): Level 4 (Blue) Shoulder ADduction: Both;15 reps;Theraband Theraband Level (Shoulder Adduction): Level 4 (Blue) Other Standing Lumbar Exercises: 4 in. step: forward 10 reps, lateral x10 reps BLE Seated Other Seated Lumbar Exercises: dyna disc: anterior/posterior pelvic rotation, side to side rotation, clockwise/counterclockwise x2 minutes each direction to improve AROM to Rt hip   Physical Therapy Assessment and Plan PT Assessment and Plan Clinical Impression Statement: Added standing activities to improve LE strengtheing and postural techniques with squatting.  Pt required PT facilaiton for proper technique in squattin and lunging exercises.  PT  Plan: Educate on proper lifting techniques    Goals    Problem List Patient Active Problem List   Diagnosis Date Noted  . Lumbago 03/30/2013  . Gluteal pain 03/30/2013  . Hip pain, right 06/09/2012  . Cystitis, acute 11/09/2011  . Hip pain 10/23/2011  . SI (sacroiliac) joint inflammation 10/23/2011  . Depressive disorder, not elsewhere classified 10/08/2010  . Prediabetes 08/16/2009  . FATIGUE 03/10/2009  . VARICOSE VEINS LOWER EXTREMITIES W/OTH COMPS 12/09/2008  . HYPERLIPIDEMIA 11/27/2007  . OBESITY 11/27/2007  . HYPERTENSION 11/27/2007  . DERMATITIS 11/27/2007    PT - End of Session Activity Tolerance: Patient tolerated treatment well  GP    [name] 04/16/2013, 10:26 AM

## 2013-04-20 ENCOUNTER — Inpatient Hospital Stay (HOSPITAL_COMMUNITY): Admission: RE | Admit: 2013-04-20 | Payer: BC Managed Care – PPO | Source: Ambulatory Visit

## 2013-04-21 ENCOUNTER — Other Ambulatory Visit: Payer: Self-pay | Admitting: Family Medicine

## 2013-04-22 ENCOUNTER — Inpatient Hospital Stay (HOSPITAL_COMMUNITY)
Admission: RE | Admit: 2013-04-22 | Payer: BC Managed Care – PPO | Source: Ambulatory Visit | Admitting: Physical Therapy

## 2013-04-23 ENCOUNTER — Ambulatory Visit (HOSPITAL_COMMUNITY)
Admission: RE | Admit: 2013-04-23 | Discharge: 2013-04-23 | Disposition: A | Discharge status: Home / Self Care | Payer: BC Managed Care – PPO | Source: Ambulatory Visit | Attending: Family Medicine | Admitting: Family Medicine

## 2013-04-23 DIAGNOSIS — M7918 Myalgia, other site: Secondary | ICD-10-CM

## 2013-04-23 DIAGNOSIS — M545 Low back pain, unspecified: Secondary | ICD-10-CM

## 2013-04-23 NOTE — Progress Notes (Signed)
Physical Therapy Treatment Patient Details  Name: [name] MRN: [id number] Date of Birth: [birth year]  Today's Date: 04/23/2013 Time: 0930-1025 PT Time Calculation (min): 55 min Charge: Therex [id number]  Visit#: 6 of 8  Re-eval: 04/29/13 Assessment Diagnosis: low back pain and sciatica Next MD Visit: Dr. Wynetta Emery - Dec 9th  Subjective: Symptoms/Limitations Symptoms: Reports she is feeling good day time, it is the night time that she has pain.  Thinks it may be the mattress.   Pain Assessment Currently in Pain?: Yes Pain Score: 2  Pain Location: Knee Pain Orientation: Right  Objective:  Exercise/Treatments Aerobic Stationary Bike: nustep 10' level 2 hills #3 SPM avg: 110 for core strengthening Standing Functional Squats: Limitations Functional Squats Limitations: 2 sets of 10 Lifting: From 12";10 reps Forward Lunge: 10 reps;Limitations Forward Lunge Limitations: BLE with facilitation Side Lunge: 10 reps;Limitations Side Lunge Limitations: BLE with PT facilation Wall Slides: 10 reps;3 seconds Other Standing Lumbar Exercises: 6in step up and step down 10x Bil LE, 4in lateral step up  Modalities Modalities: Cryotherapy Cryotherapy Number Minutes Cryotherapy: 10 Minutes Cryotherapy Location: Knee Type of Cryotherapy: Ice pack  Physical Therapy Assessment and Plan PT Assessment and Plan Clinical Impression Statement: Pt with improved mechanics following facilitation for proper lifting and lunging, able to verbalize proper technique using teach back method.  Pt reported Rt knee pain through session, ended session with ice for pain control to improve gait mechanics. PT Plan: Continue with current POC for LE strengthnieng to improve techniques with proper lifting x 2 mores sessions then re-eval.    Goals Home Exercise Program Pt/caregiver will Perform Home Exercise Program: Independently PT Short Term Goals Time to Complete Short Term Goals: 2 weeks PT  Short Term Goal 1: Pt will present with minimal muscle spasms and fascial restrictions to Rt gluteal region to report pain at night less than 3/10. PT Short Term Goal 2: Pt will present with normalized SI function for decreased gluteal pain.  PT Short Term Goal 3: Pt will be educated on proper posture and postural exercises for a maintance program in order to decrease secondary impairments.  PT Short Term Goal 3 - Progress: Progressing toward goal PT Long Term Goals Time to Complete Long Term Goals: 4 weeks PT Long Term Goal 1: Pt will improve her FOTO to Status greater than 69% and limiation less than 31% for improved percieved functional ability.  PT Long Term Goal 2: Pt will be educated on a proper lifting techniques to decrease risk of secondary injury when lifiting her grandchildren.  PT Long Term Goal 2 - Progress: Progressing toward goal Long Term Goal 3: Pt will present without muscle spasms to Rt gluteal region in order to report uninteruppted sleep due to gluteal pain.   Problem List Patient Active Problem List   Diagnosis Date Noted  . Lumbago 03/30/2013  . Gluteal pain 03/30/2013  . Hip pain, right 06/09/2012  . Cystitis, acute 11/09/2011  . Hip pain 10/23/2011  . SI (sacroiliac) joint inflammation 10/23/2011  . Depressive disorder, not elsewhere classified 10/08/2010  . Prediabetes 08/16/2009  . FATIGUE 03/10/2009  . VARICOSE VEINS LOWER EXTREMITIES W/OTH COMPS 12/09/2008  . HYPERLIPIDEMIA 11/27/2007  . OBESITY 11/27/2007  . HYPERTENSION 11/27/2007  . DERMATITIS 11/27/2007    PT - End of Session Activity Tolerance: Patient tolerated treatment well General Behavior During Therapy: [facility] for tasks assessed/performed  GP    Juel Burrow 04/23/2013, 11:36 AM

## 2013-04-27 ENCOUNTER — Telehealth (HOSPITAL_COMMUNITY): Payer: Self-pay

## 2013-04-27 ENCOUNTER — Inpatient Hospital Stay (HOSPITAL_COMMUNITY): Admission: RE | Admit: 2013-04-27 | Payer: BC Managed Care – PPO | Source: Ambulatory Visit

## 2013-04-29 ENCOUNTER — Inpatient Hospital Stay (HOSPITAL_COMMUNITY): Admission: RE | Admit: 2013-04-29 | Payer: BC Managed Care – PPO | Source: Ambulatory Visit

## 2013-10-03 ENCOUNTER — Emergency Department (HOSPITAL_COMMUNITY)
Admission: EM | Admit: 2013-10-03 | Discharge: 2013-10-03 | Disposition: A | Discharge status: Home / Self Care | Payer: BC Managed Care – PPO | Attending: Emergency Medicine | Admitting: Emergency Medicine

## 2013-10-03 ENCOUNTER — Encounter (HOSPITAL_COMMUNITY): Payer: Self-pay | Admitting: Emergency Medicine

## 2013-10-03 DIAGNOSIS — J069 Acute upper respiratory infection, unspecified: Secondary | ICD-10-CM | POA: Insufficient documentation

## 2013-10-03 DIAGNOSIS — N3000 Acute cystitis without hematuria: Secondary | ICD-10-CM | POA: Insufficient documentation

## 2013-10-03 DIAGNOSIS — E119 Type 2 diabetes mellitus without complications: Secondary | ICD-10-CM | POA: Insufficient documentation

## 2013-10-03 DIAGNOSIS — Z791 Long term (current) use of non-steroidal anti-inflammatories (NSAID): Secondary | ICD-10-CM | POA: Insufficient documentation

## 2013-10-03 DIAGNOSIS — I10 Essential (primary) hypertension: Secondary | ICD-10-CM | POA: Insufficient documentation

## 2013-10-03 DIAGNOSIS — Z872 Personal history of diseases of the skin and subcutaneous tissue: Secondary | ICD-10-CM | POA: Insufficient documentation

## 2013-10-03 DIAGNOSIS — Z79899 Other long term (current) drug therapy: Secondary | ICD-10-CM | POA: Insufficient documentation

## 2013-10-03 DIAGNOSIS — E669 Obesity, unspecified: Secondary | ICD-10-CM | POA: Insufficient documentation

## 2013-10-03 LAB — URINALYSIS, ROUTINE W REFLEX MICROSCOPIC
GLUCOSE, UA: 250 mg/dL — AB
Nitrite: POSITIVE — AB
PH: 5 (ref 5.0–8.0)
Protein, ur: >300 mg/dL — AB
Urobilinogen, UA: >8 mg/dL — ABNORMAL HIGH (ref 0.0–1.0)

## 2013-10-03 LAB — URINE MICROSCOPIC-ADD ON

## 2013-10-03 MED ORDER — CIPROFLOXACIN HCL 250 MG PO TABS
500.0000 mg | ORAL_TABLET | Freq: Once | ORAL | Status: AC
  Administered 2013-10-03: 500 mg via ORAL
  Filled 2013-10-03: qty 2

## 2013-10-03 MED ORDER — CIPROFLOXACIN HCL 500 MG PO TABS: 500.0000 mg | ORAL_TABLET | Freq: Two times a day (BID) | ORAL | Status: DC

## 2013-10-03 NOTE — Discharge Instructions (Signed)
Urinary Tract Infection A urinary tract infection (UTI) can occur any place along the urinary tract. The tract includes the kidneys, ureters, bladder, and urethra. A type of germ called bacteria often causes a UTI. UTIs are often helped with antibiotic medicine.  HOME CARE   If given, take antibiotics as told by your doctor. Finish them even if you start to feel better.  Drink enough fluids to keep your pee (urine) clear or pale yellow.  Avoid tea, drinks with caffeine, and bubbly (carbonated) drinks.  Pee often. Avoid holding your pee in for a long time.  Pee before and after having sex (intercourse).  Wipe from front to back after you poop (bowel movement) if you are a woman. Use each tissue only once. GET HELP RIGHT AWAY IF:   You have back pain.  You have lower belly (abdominal) pain.  You have chills.  You feel sick to your stomach (nauseous).  You throw up (vomit).  Your burning or discomfort with peeing does not go away.  You have a fever.  Your symptoms are not better in 3 days. MAKE SURE YOU:   Understand these instructions.  Will watch your condition.  Will get help right away if you are not doing well or get worse. Document Released: 11/07/2007 Document Revised: 02/13/2012 Document Reviewed: 12/20/2011 [facility]ExitCare Patient Information 2014 [place].   Increase fluids. Antibiotic twice a day for one week.   Followup your primary care Dr.

## 2013-10-03 NOTE — ED Notes (Signed)
C/o dysuria and frequency as well that started today. Took AZO today.

## 2013-10-03 NOTE — ED Notes (Signed)
Pt received discharge instructions and prescriptions, verbalized understanding and has no further questions. Pt ambulated to exit in stable condition accompanied by family.  Advised to return to emergency department with new or worsening symptoms.  

## 2013-10-03 NOTE — ED Provider Notes (Signed)
CSN: [id number]     Arrival date & time 10/03/13  1228 History  This chart was scribed for Alisha HutchingBrian Kasia Trego, MD by Quintella ReichertMatthew Underwood, ED scribe.  This patient was seen in room APFT20/APFT20 and the patient's care was started at 1:30 PM.   Chief Complaint  Patient presents with  . Dysuria    The history is provided by the patient. No language interpreter was used.    HPI Comments: Alisha GroveMildred M Mcconnell is a 63 y.o. female with h/o DM, HTN and hyperlipidemia who presents to the Emergency Department complaining of dysuria that began this morning with associated urinary frequency.  Pt states she has had pain on urination since earlier this morning.  She has attempted to treat symptoms with AZO, without relief.  She denies back pain.  She reports she has had UTIs in the past and her current symptoms feel the same.  She has been treated successfully with Cipro in the past.  She notes that she is currently recovering from a recent URI and she has had some fever in association with this.  She has been trying to stay well-hydrated.    Past Medical History  Diagnosis Date  . IGT (impaired glucose tolerance)   . Dermatitis   . Obesity   . Hyperlipidemia   . Hypertension   . Diabetes mellitus     Past Surgical History  Procedure Laterality Date  . Right breast biopsy calcification  benign  2003  . Tubal ligation      Family History  Problem Relation Age of Onset  . Addison's disease Mother   . Diabetes Mother   . Heart disease Mother   . Stroke Father   . Heart attack Father   . Heart disease Father   . Irregular heart beat Sister   . Hypertension Sister   . Heart disease Sister   . Diabetes Sister   . Hypertension Sister   . Coronary artery disease Brother   . Heart disease Brother     History  Substance Use Topics  . Smoking status: Never Smoker   . Smokeless tobacco: Not on file  . Alcohol Use: No    OB History   Grav Para Term Preterm Abortions TAB SAB Ect Mult Living                    Review of Systems A complete 10 system review of systems was obtained and all systems are negative except as noted in the HPI and PMH.     Allergies  Codeine and Sulfonamide derivatives  Home Medications   Prior to Admission medications   Medication Sig Start Date End Date Taking? Authorizing Provider  atenolol (TENORMIN) 25 MG tablet Take 25 mg by mouth daily.    Historical Provider, MD  celecoxib (CELEBREX) 200 MG capsule Take 200 mg by mouth daily.    Historical Provider, MD  cetirizine (ZYRTEC) 10 MG tablet Take 1 tablet (10 mg total) by mouth daily. 02/19/13   Kathie DikeHobson M Bryant, PA-C  citalopram (CELEXA) 20 MG tablet Take 20 mg by mouth daily.    Historical Provider, MD  hydrOXYzine (VISTARIL) 25 MG capsule 1 po qhs prn itching and hives. 02/19/13   Kathie DikeHobson M Bryant, PA-C  lisinopril-hydrochlorothiazide (PRINZIDE,ZESTORETIC) 10-12.5 MG per tablet take 1 tablet by mouth once daily 03/23/13   Kerri PerchesMargaret E Simpson, MD  predniSONE (DELTASONE) 10 MG tablet 5,4,3,2,1 - take with food 02/19/13   Kathie DikeHobson M Bryant, PA-C   BP 152/76  Pulse 94  Temp(Src) 97.9 F (36.6 C) (Oral)  Resp 16  SpO2 98%  Physical Exam  Nursing note and vitals reviewed. Constitutional: She is oriented to person, place, and time. She appears well-developed and well-nourished.  HENT:  Head: Normocephalic and atraumatic.  Eyes: Conjunctivae and EOM are normal. Pupils are equal, round, and reactive to light.  Neck: Normal range of motion. Neck supple.  Cardiovascular: Normal rate, regular rhythm and normal heart sounds.   Pulmonary/Chest: Effort normal and breath sounds normal.  Abdominal: Soft. Bowel sounds are normal. There is no tenderness.  No suprapubic tenderness  Musculoskeletal: Normal range of motion.  Neurological: She is alert and oriented to person, place, and time.  Skin: Skin is warm and dry.  Psychiatric: She has a normal mood and affect. Her behavior is normal.    ED Course  Procedures  (including critical care time)  DIAGNOSTIC STUDIES: Oxygen Saturation is 98% on room air, normal by my interpretation.    COORDINATION OF CARE: 1:34 PM-Discussed treatment plan which includes Cipro with pt at bedside and pt agreed to plan.     Labs Review Labs Reviewed  URINALYSIS, ROUTINE W REFLEX MICROSCOPIC - Abnormal; Notable for the following:    Color, Urine ORANGE (*)    APPearance HAZY (*)    Specific Gravity, Urine >1.030 (*)    Glucose, UA 250 (*)    Hgb urine dipstick LARGE (*)    Bilirubin Urine MODERATE (*)    Ketones, ur TRACE (*)    Protein, ur >300 (*)    Urobilinogen, UA >8.0 (*)    Nitrite POSITIVE (*)    Leukocytes, UA SMALL (*)    All other components within normal limits  URINE MICROSCOPIC-ADD ON - Abnormal; Notable for the following:    Squamous Epithelial / LPF MANY (*)    Bacteria, UA FEW (*)    All other components within normal limits    Imaging Review No results found.    EKG Interpretation None      MDM   Final diagnoses:  Cystitis, acute    History and physical consistent with uncomplicated cystitis.  No fever or flank pain. Cipro has worked in the past. Rx Cipro 500 twice a day for one-week    I personally performed the services described in this documentation, which was scribed in my presence. The recorded information has been reviewed and is accurate.    Alisha HutchingBrian Raunak Antuna, MD 10/03/13 [phone]

## 2014-10-14 LAB — HM DIABETES EYE EXAM

## 2014-11-02 DIAGNOSIS — M47817 Spondylosis without myelopathy or radiculopathy, lumbosacral region: Secondary | ICD-10-CM | POA: Diagnosis not present

## 2014-11-02 HISTORY — PX: OTHER SURGICAL HISTORY: SHX169

## 2014-11-30 DIAGNOSIS — H40019 Open angle with borderline findings, low risk, unspecified eye: Secondary | ICD-10-CM | POA: Diagnosis not present

## 2014-12-07 DIAGNOSIS — Z6832 Body mass index (BMI) 32.0-32.9, adult: Secondary | ICD-10-CM | POA: Diagnosis not present

## 2014-12-07 DIAGNOSIS — M5126 Other intervertebral disc displacement, lumbar region: Secondary | ICD-10-CM | POA: Diagnosis not present

## 2014-12-07 DIAGNOSIS — I10 Essential (primary) hypertension: Secondary | ICD-10-CM | POA: Diagnosis not present

## 2014-12-07 DIAGNOSIS — M47817 Spondylosis without myelopathy or radiculopathy, lumbosacral region: Secondary | ICD-10-CM | POA: Diagnosis not present

## 2014-12-23 DIAGNOSIS — I10 Essential (primary) hypertension: Secondary | ICD-10-CM | POA: Diagnosis not present

## 2014-12-23 DIAGNOSIS — E1165 Type 2 diabetes mellitus with hyperglycemia: Secondary | ICD-10-CM | POA: Diagnosis not present

## 2014-12-23 DIAGNOSIS — E782 Mixed hyperlipidemia: Secondary | ICD-10-CM | POA: Diagnosis not present

## 2014-12-28 DIAGNOSIS — E785 Hyperlipidemia, unspecified: Secondary | ICD-10-CM | POA: Diagnosis not present

## 2014-12-28 DIAGNOSIS — M5410 Radiculopathy, site unspecified: Secondary | ICD-10-CM | POA: Diagnosis not present

## 2014-12-28 DIAGNOSIS — I10 Essential (primary) hypertension: Secondary | ICD-10-CM | POA: Diagnosis not present

## 2014-12-28 DIAGNOSIS — E119 Type 2 diabetes mellitus without complications: Secondary | ICD-10-CM | POA: Diagnosis not present

## 2015-02-16 ENCOUNTER — Other Ambulatory Visit (HOSPITAL_COMMUNITY): Payer: Self-pay | Admitting: Internal Medicine

## 2015-02-16 DIAGNOSIS — Z1231 Encounter for screening mammogram for malignant neoplasm of breast: Secondary | ICD-10-CM

## 2015-02-23 ENCOUNTER — Ambulatory Visit (HOSPITAL_COMMUNITY)
Admission: RE | Admit: 2015-02-23 | Discharge: 2015-02-23 | Disposition: A | Discharge status: Home / Self Care | Payer: Medicare PPO | Source: Ambulatory Visit | Attending: Internal Medicine | Admitting: Internal Medicine

## 2015-02-23 DIAGNOSIS — R300 Dysuria: Secondary | ICD-10-CM | POA: Diagnosis not present

## 2015-02-23 DIAGNOSIS — Z23 Encounter for immunization: Secondary | ICD-10-CM | POA: Diagnosis not present

## 2015-02-23 DIAGNOSIS — Z1231 Encounter for screening mammogram for malignant neoplasm of breast: Secondary | ICD-10-CM | POA: Insufficient documentation

## 2015-02-28 ENCOUNTER — Ambulatory Visit (INDEPENDENT_AMBULATORY_CARE_PROVIDER_SITE_OTHER): Payer: Medicare PPO | Admitting: Adult Health

## 2015-02-28 ENCOUNTER — Encounter: Payer: Self-pay | Admitting: Adult Health

## 2015-02-28 ENCOUNTER — Other Ambulatory Visit (HOSPITAL_COMMUNITY)
Admission: RE | Admit: 2015-02-28 | Discharge: 2015-02-28 | Disposition: A | Discharge status: Home / Self Care | Payer: Medicare PPO | Source: Ambulatory Visit | Attending: Adult Health | Admitting: Adult Health

## 2015-02-28 VITALS — BP 130/62 | HR 76 | Temp 97.9°F | Ht 64.25 in | Wt 192.0 lb

## 2015-02-28 DIAGNOSIS — Z1151 Encounter for screening for human papillomavirus (HPV): Secondary | ICD-10-CM | POA: Diagnosis not present

## 2015-02-28 DIAGNOSIS — Z124 Encounter for screening for malignant neoplasm of cervix: Secondary | ICD-10-CM

## 2015-02-28 DIAGNOSIS — Z01419 Encounter for gynecological examination (general) (routine) without abnormal findings: Secondary | ICD-10-CM | POA: Diagnosis not present

## 2015-02-28 DIAGNOSIS — Z1212 Encounter for screening for malignant neoplasm of rectum: Secondary | ICD-10-CM

## 2015-02-28 DIAGNOSIS — R350 Frequency of micturition: Secondary | ICD-10-CM | POA: Insufficient documentation

## 2015-02-28 DIAGNOSIS — N390 Urinary tract infection, site not specified: Secondary | ICD-10-CM | POA: Diagnosis not present

## 2015-02-28 HISTORY — DX: Frequency of micturition: R35.0

## 2015-02-28 HISTORY — DX: Urinary tract infection, site not specified: N39.0

## 2015-02-28 LAB — POCT URINALYSIS DIPSTICK
Nitrite, UA: POSITIVE
PROTEIN UA: NEGATIVE

## 2015-02-28 LAB — HEMOCCULT GUIAC POC 1CARD (OFFICE): Fecal Occult Blood, POC: NEGATIVE

## 2015-02-28 MED ORDER — CIPROFLOXACIN HCL 500 MG PO TABS: 500.0000 mg | ORAL_TABLET | Freq: Two times a day (BID) | ORAL | Status: DC

## 2015-02-28 NOTE — Progress Notes (Signed)
Patient ID: [name], female   DOB: 31-May-1951, 64 y.o.   MRN: [id number] History of Present Illness: [name] is a 64 year old white female in for well woman gyn exam and pap and complains of urinary frequency and pressure.Has taken AZO. PCP is Dr Margo Aye.   Current Medications, Allergies, Past Medical History, Past Surgical History, Family History and Social History were reviewed in [employer] record.     Review of Systems: Patient denies any headaches, hearing loss, fatigue, blurred vision, shortness of breath, chest pain, abdominal pain, problems with bowel movements, or intercourse. No joint pain or mood swings.See HPI for positives.    Physical Exam:BP 130/62 mmHg  Pulse 76  Temp(Src) 97.9 F (36.6 C)  Ht 5' 4.25" (1.632 m)  Wt 192 lb (87.091 kg)  BMI 32.70 kg/m2 urine dipstick +nitrates,trace blood and trace leuks and trace glucose. General:  Well developed, well nourished, no acute distress Skin:  Warm and dry Neck:  Midline trachea, normal thyroid, good ROM, no lymphadenopathy, no carotid bruits heard Lungs; Clear to auscultation bilaterally Breast:  No dominant palpable mass, retraction, or nipple discharge Cardiovascular: Regular rate and rhythm Abdomen:  Soft, non tender, no hepatosplenomegaly Pelvic:  External genitalia is normal in appearance, no lesions.  The vagina is normal in appearance. Urethra has no lesions or masses. The cervix is smooth, pap with HPV performed.Marland Kitchen  Uterus is felt to be normal size, shape, and contour.  No adnexal masses or tenderness noted.Bladder is non tender, no masses felt. Rectal: Good sphincter tone, no polyps, or hemorrhoids felt.  Hemoccult negative. Extremities/musculoskeletal:  No swelling or varicosities noted, no clubbing or cyanosis Psych:  No mood changes, alert and cooperative,seems happy   Impression: Well woman gyn exam with pap Urinary frequency  UTI    Plan: Rx cipro 500 mg #10 1 bid x 5  days UA C&S sent Ok to use AZO Pelvic in 2 years, pap in 3 if normal Mammogram yearly Colonoscopy per Dr Lovell Sheehan Got flu shot with PCP Labs with PCP

## 2015-02-28 NOTE — Patient Instructions (Signed)
Pelvic in 2 year, pap in 3 years if normal Mammogram yearly  Colonoscopy per Dr Lovell Sheehan Urinary Tract Infection Urinary tract infections (UTIs) can develop anywhere along your urinary tract. Your urinary tract is your body's drainage system for removing wastes and extra water. Your urinary tract includes two kidneys, two ureters, a bladder, and a urethra. Your kidneys are a pair of bean-shaped organs. Each kidney is about the size of your fist. They are located below your ribs, one on each side of your spine. CAUSES Infections are caused by microbes, which are microscopic organisms, including fungi, viruses, and bacteria. These organisms are so small that they can only be seen through a microscope. Bacteria are the microbes that most commonly cause UTIs. SYMPTOMS  Symptoms of UTIs may vary by age and gender of the patient and by the location of the infection. Symptoms in young women typically include a frequent and intense urge to urinate and a painful, burning feeling in the bladder or urethra during urination. Older women and men are more likely to be tired, shaky, and weak and have muscle aches and abdominal pain. A fever may mean the infection is in your kidneys. Other symptoms of a kidney infection include pain in your back or sides below the ribs, nausea, and vomiting. DIAGNOSIS To diagnose a UTI, your caregiver will ask you about your symptoms. Your caregiver also will ask to provide a urine sample. The urine sample will be tested for bacteria and white blood cells. White blood cells are made by your body to help fight infection. TREATMENT  Typically, UTIs can be treated with medication. Because most UTIs are caused by a bacterial infection, they usually can be treated with the use of antibiotics. The choice of antibiotic and length of treatment depend on your symptoms and the type of bacteria causing your infection. HOME CARE INSTRUCTIONS  If you were prescribed antibiotics, take them exactly  as your caregiver instructs you. Finish the medication even if you feel better after you have only taken some of the medication.  Drink enough water and fluids to keep your urine clear or pale yellow.  Avoid caffeine, tea, and carbonated beverages. They tend to irritate your bladder.  Empty your bladder often. Avoid holding urine for long periods of time.  Empty your bladder before and after sexual intercourse.  After a bowel movement, women should cleanse from front to back. Use each tissue only once. SEEK MEDICAL CARE IF:   You have back pain.  You develop a fever.  Your symptoms do not begin to resolve within 3 days. SEEK IMMEDIATE MEDICAL CARE IF:   You have severe back pain or lower abdominal pain.  You develop chills.  You have nausea or vomiting.  You have continued burning or discomfort with urination. MAKE SURE YOU:   Understand these instructions.  Will watch your condition.  Will get help right away if you are not doing well or get worse. Document Released: 02/28/2005 Document Revised: 11/20/2011 Document Reviewed: 06/29/2011 [facility] Patient Information 2015 [place], [place]. This information is not intended to replace advice given to you by your health care provider. Make sure you discuss any questions you have with your health care provider. PUSH water Labs with PCP

## 2015-03-01 DIAGNOSIS — N390 Urinary tract infection, site not specified: Secondary | ICD-10-CM | POA: Diagnosis not present

## 2015-03-02 LAB — MICROSCOPIC EXAMINATION: Casts: NONE SEEN /lpf

## 2015-03-02 LAB — URINALYSIS, ROUTINE W REFLEX MICROSCOPIC
BILIRUBIN UA: NEGATIVE
Glucose, UA: NEGATIVE
KETONES UA: NEGATIVE
NITRITE UA: POSITIVE — AB
PH UA: 5.5 (ref 5.0–7.5)
Protein, UA: NEGATIVE
RBC UA: NEGATIVE
SPEC GRAV UA: 1.016 (ref 1.005–1.030)
UUROB: 1 mg/dL (ref 0.2–1.0)

## 2015-03-02 LAB — CYTOLOGY - PAP

## 2015-03-05 LAB — URINE CULTURE

## 2015-05-02 DIAGNOSIS — I10 Essential (primary) hypertension: Secondary | ICD-10-CM | POA: Diagnosis not present

## 2015-05-02 DIAGNOSIS — E1165 Type 2 diabetes mellitus with hyperglycemia: Secondary | ICD-10-CM | POA: Diagnosis not present

## 2015-05-02 DIAGNOSIS — E782 Mixed hyperlipidemia: Secondary | ICD-10-CM | POA: Diagnosis not present

## 2015-05-04 ENCOUNTER — Ambulatory Visit (HOSPITAL_COMMUNITY)
Admission: RE | Admit: 2015-05-04 | Discharge: 2015-05-04 | Disposition: A | Discharge status: Home / Self Care | Payer: Medicare PPO | Source: Ambulatory Visit | Attending: Internal Medicine | Admitting: Internal Medicine

## 2015-05-04 ENCOUNTER — Other Ambulatory Visit (HOSPITAL_COMMUNITY): Payer: Self-pay | Admitting: Internal Medicine

## 2015-05-04 DIAGNOSIS — I10 Essential (primary) hypertension: Secondary | ICD-10-CM | POA: Diagnosis not present

## 2015-05-04 DIAGNOSIS — M25551 Pain in right hip: Secondary | ICD-10-CM | POA: Diagnosis not present

## 2015-05-04 DIAGNOSIS — M47896 Other spondylosis, lumbar region: Secondary | ICD-10-CM | POA: Diagnosis not present

## 2015-05-04 DIAGNOSIS — M1611 Unilateral primary osteoarthritis, right hip: Secondary | ICD-10-CM | POA: Diagnosis not present

## 2015-05-04 DIAGNOSIS — R05 Cough: Secondary | ICD-10-CM | POA: Diagnosis not present

## 2015-05-04 DIAGNOSIS — F411 Generalized anxiety disorder: Secondary | ICD-10-CM | POA: Diagnosis not present

## 2015-05-04 DIAGNOSIS — E1165 Type 2 diabetes mellitus with hyperglycemia: Secondary | ICD-10-CM | POA: Diagnosis not present

## 2015-05-04 DIAGNOSIS — E782 Mixed hyperlipidemia: Secondary | ICD-10-CM | POA: Diagnosis not present

## 2015-05-04 DIAGNOSIS — F339 Major depressive disorder, recurrent, unspecified: Secondary | ICD-10-CM | POA: Diagnosis not present

## 2015-05-04 DIAGNOSIS — J00 Acute nasopharyngitis [common cold]: Secondary | ICD-10-CM | POA: Diagnosis not present

## 2015-07-12 ENCOUNTER — Encounter: Payer: Self-pay | Admitting: *Deleted

## 2015-08-17 DIAGNOSIS — M25551 Pain in right hip: Secondary | ICD-10-CM | POA: Diagnosis not present

## 2015-09-22 DIAGNOSIS — I10 Essential (primary) hypertension: Secondary | ICD-10-CM | POA: Diagnosis not present

## 2015-09-22 DIAGNOSIS — E782 Mixed hyperlipidemia: Secondary | ICD-10-CM | POA: Diagnosis not present

## 2015-09-22 DIAGNOSIS — E1165 Type 2 diabetes mellitus with hyperglycemia: Secondary | ICD-10-CM | POA: Diagnosis not present

## 2015-09-27 DIAGNOSIS — I10 Essential (primary) hypertension: Secondary | ICD-10-CM | POA: Diagnosis not present

## 2015-09-27 DIAGNOSIS — E782 Mixed hyperlipidemia: Secondary | ICD-10-CM | POA: Diagnosis not present

## 2015-09-27 DIAGNOSIS — M5418 Radiculopathy, sacral and sacrococcygeal region: Secondary | ICD-10-CM | POA: Diagnosis not present

## 2015-09-27 DIAGNOSIS — E1165 Type 2 diabetes mellitus with hyperglycemia: Secondary | ICD-10-CM | POA: Diagnosis not present

## 2015-11-01 DIAGNOSIS — J0100 Acute maxillary sinusitis, unspecified: Secondary | ICD-10-CM | POA: Diagnosis not present

## 2015-11-21 DIAGNOSIS — J060 Acute laryngopharyngitis: Secondary | ICD-10-CM | POA: Diagnosis not present

## 2015-12-05 DIAGNOSIS — I10 Essential (primary) hypertension: Secondary | ICD-10-CM | POA: Diagnosis not present

## 2016-01-25 DIAGNOSIS — M25559 Pain in unspecified hip: Secondary | ICD-10-CM | POA: Diagnosis not present

## 2016-03-28 DIAGNOSIS — E1165 Type 2 diabetes mellitus with hyperglycemia: Secondary | ICD-10-CM | POA: Diagnosis not present

## 2016-03-28 DIAGNOSIS — I10 Essential (primary) hypertension: Secondary | ICD-10-CM | POA: Diagnosis not present

## 2016-04-11 DIAGNOSIS — I10 Essential (primary) hypertension: Secondary | ICD-10-CM | POA: Diagnosis not present

## 2016-04-11 DIAGNOSIS — M5418 Radiculopathy, sacral and sacrococcygeal region: Secondary | ICD-10-CM | POA: Diagnosis not present

## 2016-04-11 DIAGNOSIS — F339 Major depressive disorder, recurrent, unspecified: Secondary | ICD-10-CM | POA: Diagnosis not present

## 2016-04-11 DIAGNOSIS — M545 Low back pain: Secondary | ICD-10-CM | POA: Diagnosis not present

## 2016-04-11 DIAGNOSIS — Z23 Encounter for immunization: Secondary | ICD-10-CM | POA: Diagnosis not present

## 2016-04-11 DIAGNOSIS — E782 Mixed hyperlipidemia: Secondary | ICD-10-CM | POA: Diagnosis not present

## 2016-04-11 DIAGNOSIS — F411 Generalized anxiety disorder: Secondary | ICD-10-CM | POA: Diagnosis not present

## 2016-04-11 DIAGNOSIS — Z0000 Encounter for general adult medical examination without abnormal findings: Secondary | ICD-10-CM | POA: Diagnosis not present

## 2016-04-11 DIAGNOSIS — E1165 Type 2 diabetes mellitus with hyperglycemia: Secondary | ICD-10-CM | POA: Diagnosis not present

## 2016-09-18 DIAGNOSIS — Z6832 Body mass index (BMI) 32.0-32.9, adult: Secondary | ICD-10-CM | POA: Diagnosis not present

## 2016-09-18 DIAGNOSIS — R05 Cough: Secondary | ICD-10-CM | POA: Diagnosis not present

## 2016-09-18 DIAGNOSIS — J060 Acute laryngopharyngitis: Secondary | ICD-10-CM | POA: Diagnosis not present

## 2016-10-03 DIAGNOSIS — I10 Essential (primary) hypertension: Secondary | ICD-10-CM | POA: Diagnosis not present

## 2016-10-03 DIAGNOSIS — E782 Mixed hyperlipidemia: Secondary | ICD-10-CM | POA: Diagnosis not present

## 2016-10-03 DIAGNOSIS — E1165 Type 2 diabetes mellitus with hyperglycemia: Secondary | ICD-10-CM | POA: Diagnosis not present

## 2016-10-06 DIAGNOSIS — E782 Mixed hyperlipidemia: Secondary | ICD-10-CM | POA: Diagnosis not present

## 2016-10-06 DIAGNOSIS — I10 Essential (primary) hypertension: Secondary | ICD-10-CM | POA: Diagnosis not present

## 2016-10-06 DIAGNOSIS — E1165 Type 2 diabetes mellitus with hyperglycemia: Secondary | ICD-10-CM | POA: Diagnosis not present

## 2016-10-06 DIAGNOSIS — N182 Chronic kidney disease, stage 2 (mild): Secondary | ICD-10-CM | POA: Diagnosis not present

## 2016-10-06 DIAGNOSIS — M5440 Lumbago with sciatica, unspecified side: Secondary | ICD-10-CM | POA: Diagnosis not present

## 2016-10-06 DIAGNOSIS — M545 Low back pain: Secondary | ICD-10-CM | POA: Diagnosis not present

## 2016-10-06 DIAGNOSIS — F411 Generalized anxiety disorder: Secondary | ICD-10-CM | POA: Diagnosis not present

## 2016-10-06 DIAGNOSIS — Z6831 Body mass index (BMI) 31.0-31.9, adult: Secondary | ICD-10-CM | POA: Diagnosis not present

## 2016-10-06 DIAGNOSIS — F331 Major depressive disorder, recurrent, moderate: Secondary | ICD-10-CM | POA: Diagnosis not present

## 2017-02-19 ENCOUNTER — Emergency Department (HOSPITAL_COMMUNITY)
Admission: EM | Admit: 2017-02-19 | Discharge: 2017-02-19 | Disposition: A | Discharge status: Home / Self Care | Payer: Medicare PPO | Attending: Emergency Medicine | Admitting: Emergency Medicine

## 2017-02-19 ENCOUNTER — Encounter (HOSPITAL_COMMUNITY): Payer: Self-pay | Admitting: Cardiology

## 2017-02-19 DIAGNOSIS — B379 Candidiasis, unspecified: Secondary | ICD-10-CM

## 2017-02-19 DIAGNOSIS — I10 Essential (primary) hypertension: Secondary | ICD-10-CM | POA: Diagnosis not present

## 2017-02-19 DIAGNOSIS — Z79899 Other long term (current) drug therapy: Secondary | ICD-10-CM | POA: Diagnosis not present

## 2017-02-19 DIAGNOSIS — Z7984 Long term (current) use of oral hypoglycemic drugs: Secondary | ICD-10-CM | POA: Insufficient documentation

## 2017-02-19 DIAGNOSIS — E119 Type 2 diabetes mellitus without complications: Secondary | ICD-10-CM | POA: Diagnosis not present

## 2017-02-19 DIAGNOSIS — R21 Rash and other nonspecific skin eruption: Secondary | ICD-10-CM | POA: Diagnosis present

## 2017-02-19 DIAGNOSIS — B372 Candidiasis of skin and nail: Secondary | ICD-10-CM | POA: Diagnosis not present

## 2017-02-19 LAB — CBG MONITORING, ED: GLUCOSE-CAPILLARY: 104 mg/dL — AB (ref 65–99)

## 2017-02-19 MED ORDER — CLOTRIMAZOLE 1 % EX CREA: TOPICAL_CREAM | CUTANEOUS | 0 refills | Status: DC

## 2017-02-19 NOTE — Discharge Instructions (Signed)
Try to keep the skin dry as possible.  Follow-up with your primary provider for recheck.

## 2017-02-19 NOTE — ED Provider Notes (Signed)
[facility]-EMER[facility]ENCY DEPT Provider Note   CSN: [id number] Arrival date & time: 02/19/17  1609     History   Chief Complaint Chief Complaint  Patient presents with  . Rash    HPI [name] is a 66 y.o. female.  HPI   [name] is a 66 y.o. female with hx of DM, HTN, and hyperlipidemia who presents to the Emergency Department complaining of itching, painful rash underneath both breasts for one week.  Rash developed after working outside and sweating heavily. She has tried neosporin, body powder without relief.  States that wearing a bra makes it worse, nothing makes it better.  She denies poison oak or ivy exposure, swelling and drainage.     Past Medical History:  Diagnosis Date  . Dermatitis   . Diabetes mellitus   . Hyperlipidemia   . Hypertension   . IGT (impaired glucose tolerance)   . Obesity   . Spinal stenosis   . Urinary frequency 02/28/2015  . UTI (lower urinary tract infection) 02/28/2015    Patient Active Problem List   Diagnosis Date Noted  . Urinary frequency 02/28/2015  . UTI (lower urinary tract infection) 02/28/2015  . Lumbago 03/30/2013  . Gluteal pain 03/30/2013  . Hip pain, right 06/09/2012  . Cystitis, acute 11/09/2011  . Hip pain 10/23/2011  . SI (sacroiliac) joint inflammation (HCC) 10/23/2011  . Depressive disorder, not elsewhere classified 10/08/2010  . Prediabetes 08/16/2009  . FATIGUE 03/10/2009  . VARICOSE VEINS LOWER EXTREMITIES W/OTH COMPS 12/09/2008  . HYPERLIPIDEMIA 11/27/2007  . OBESITY 11/27/2007  . HYPERTENSION 11/27/2007  . DERMATITIS 11/27/2007    Past Surgical History:  Procedure Laterality Date  . right breast biopsy calcification  benign  2003  . spinal ablation  11/02/14  . TUBAL LIGATION      OB History    Gravida Para Term Preterm AB Living   SAB TAB Ectopic Multiple Live Births                   Home Medications    Prior to Admission medications   Medication Sig Start Date End Date  Taking? Authorizing Provider  atenolol (TENORMIN) 25 MG tablet Take 25 mg by mouth daily.    [provider]  ciprofloxacin (CIPRO) 500 MG tablet Take 1 tablet (500 mg total) by mouth 2 (two) times daily. 02/28/15   Adline Potter, NP  citalopram (CELEXA) 20 MG tablet Take 20 mg by mouth daily.    [provider]  lisinopril-hydrochlorothiazide (PRINZIDE,ZESTORETIC) 10-12.5 MG per tablet take 1 tablet by mouth once daily 03/23/13   Kerri Perches, MD  metFORMIN (GLUCOPHAGE) 500 MG tablet Take by mouth 2 (two) times daily with a meal.    [provider]    Family History Family History  Problem Relation Age of Onset  . Addison's disease Mother   . Diabetes Mother   . Heart disease Mother   . Stroke Father   . Heart attack Father   . Heart disease Father   . Irregular heart beat Sister   . Hypertension Sister   . Heart disease Sister   . Diabetes Sister   . Hypertension Sister   . Coronary artery disease Brother   . Heart disease Brother   . Diabetes Brother   . Hypertension Brother     Social History Social History  Substance Use Topics  . Smoking status: Never Smoker  .  Smokeless tobacco: Never Used  . Alcohol use No     Allergies   Codeine and Sulfonamide derivatives   Review of Systems Review of Systems  Constitutional: Negative for activity change, appetite change, chills and fever.  HENT: Negative for facial swelling, sore throat and trouble swallowing.   Respiratory: Negative for chest tightness, shortness of breath and wheezing.   Gastrointestinal: Negative for abdominal pain, nausea and vomiting.  Endocrine: Negative for polydipsia and polyuria.  Musculoskeletal: Negative for neck pain and neck stiffness.  Skin: Positive for color change and rash. Negative for wound.  Neurological: Negative for dizziness, weakness, numbness and headaches.  All other systems reviewed and are negative.    Physical Exam Updated Vital  Signs BP (!) 178/87 (BP Location: Right Arm)   Pulse 82   Temp 98.3 F (36.8 C) (Oral)   Resp 20   Ht  (1.676 m)   Wt 83.5 kg (184 lb)   SpO2 94%   BMI 29.70 kg/m   Physical Exam  Constitutional: She is oriented to person, place, and time. She appears well-developed and well-nourished. No distress.  HENT:  Head: Normocephalic and atraumatic.  Mouth/Throat: Oropharynx is clear and moist.  Neck: Normal range of motion. Neck supple.  Cardiovascular: Normal rate, regular rhythm, normal heart sounds and intact distal pulses.   No murmur heard. Pulmonary/Chest: Effort normal and breath sounds normal. No respiratory distress.  Abdominal: Soft. She exhibits no distension. There is no tenderness.  Musculoskeletal: She exhibits no edema or tenderness.  Lymphadenopathy:    She has no cervical adenopathy.  Neurological: She is alert and oriented to person, place, and time. She exhibits normal muscle tone. Coordination normal.  Skin: Skin is warm. Capillary refill takes less than 2 seconds. Rash noted. There is erythema.  Confluent, macular rash of the skin folds of bilateral breasts.  No edema or drainage  Psychiatric: She has a normal mood and affect.  Nursing note and vitals reviewed.    ED Treatments / Results  Labs (all labs ordered are listed, but only abnormal results are displayed) Labs Reviewed  CBG MONITORING, ED - Abnormal; Notable for the following:       Result Value   Glucose-Capillary 104 (*)    All other components within normal limits    EKG  EKG Interpretation None       Radiology No results found.  Procedures Procedures (including critical care time)  Medications Ordered in ED Medications - No data to display   Initial Impression / Assessment and Plan / ED Course  I have reviewed the triage vital signs and the nursing notes.  Pertinent labs & imaging results that were available during my care of the patient were reviewed by me and considered  in my medical decision making (see chart for details).     Pt well appearing.  vitals reviewed.  Rash appears c/w candida.  Will treat with antifungal.  Pt agrees to f/u with PCP regarding BP,  No sx's at present.    Final Clinical Impressions(s) / ED Diagnoses   Final diagnoses:  Candida infection    New Prescriptions New Prescriptions   No medications on file     Pauline Aus, Cordelia Poche 02/19/17 1728    Doug Sou, MD 02/19/17 2326

## 2017-02-19 NOTE — ED Notes (Signed)
Pa eval and dc prior to nsg assessment

## 2017-02-19 NOTE — ED Triage Notes (Signed)
Rash under bilateral breast off and on times one week.  Left worse than right.

## 2017-04-05 DIAGNOSIS — Z23 Encounter for immunization: Secondary | ICD-10-CM | POA: Diagnosis not present

## 2017-04-05 DIAGNOSIS — E1165 Type 2 diabetes mellitus with hyperglycemia: Secondary | ICD-10-CM | POA: Diagnosis not present

## 2017-04-05 DIAGNOSIS — E782 Mixed hyperlipidemia: Secondary | ICD-10-CM | POA: Diagnosis not present

## 2017-04-05 DIAGNOSIS — I10 Essential (primary) hypertension: Secondary | ICD-10-CM | POA: Diagnosis not present

## 2017-04-09 DIAGNOSIS — Z01419 Encounter for gynecological examination (general) (routine) without abnormal findings: Secondary | ICD-10-CM | POA: Diagnosis not present

## 2017-04-16 ENCOUNTER — Encounter (INDEPENDENT_AMBULATORY_CARE_PROVIDER_SITE_OTHER): Payer: Self-pay | Admitting: *Deleted

## 2017-05-20 ENCOUNTER — Other Ambulatory Visit (HOSPITAL_COMMUNITY): Payer: Self-pay | Admitting: Internal Medicine

## 2017-05-20 DIAGNOSIS — Z780 Asymptomatic menopausal state: Secondary | ICD-10-CM

## 2017-05-20 DIAGNOSIS — Z1231 Encounter for screening mammogram for malignant neoplasm of breast: Secondary | ICD-10-CM

## 2017-06-10 ENCOUNTER — Ambulatory Visit (HOSPITAL_COMMUNITY)
Admission: RE | Admit: 2017-06-10 | Discharge: 2017-06-10 | Disposition: A | Discharge status: Home / Self Care | Payer: Medicare PPO | Source: Ambulatory Visit | Attending: Internal Medicine | Admitting: Internal Medicine

## 2017-06-10 DIAGNOSIS — Z1231 Encounter for screening mammogram for malignant neoplasm of breast: Secondary | ICD-10-CM | POA: Diagnosis not present

## 2017-06-10 DIAGNOSIS — Z780 Asymptomatic menopausal state: Secondary | ICD-10-CM | POA: Diagnosis not present

## 2017-06-10 DIAGNOSIS — Z13820 Encounter for screening for osteoporosis: Secondary | ICD-10-CM | POA: Diagnosis not present

## 2017-10-07 DIAGNOSIS — E782 Mixed hyperlipidemia: Secondary | ICD-10-CM | POA: Diagnosis not present

## 2017-10-07 DIAGNOSIS — E1165 Type 2 diabetes mellitus with hyperglycemia: Secondary | ICD-10-CM | POA: Diagnosis not present

## 2017-10-09 DIAGNOSIS — M545 Low back pain: Secondary | ICD-10-CM | POA: Diagnosis not present

## 2017-10-09 DIAGNOSIS — F419 Anxiety disorder, unspecified: Secondary | ICD-10-CM | POA: Diagnosis not present

## 2017-10-09 DIAGNOSIS — E782 Mixed hyperlipidemia: Secondary | ICD-10-CM | POA: Diagnosis not present

## 2017-10-09 DIAGNOSIS — Z6832 Body mass index (BMI) 32.0-32.9, adult: Secondary | ICD-10-CM | POA: Diagnosis not present

## 2017-10-09 DIAGNOSIS — F331 Major depressive disorder, recurrent, moderate: Secondary | ICD-10-CM | POA: Diagnosis not present

## 2017-10-09 DIAGNOSIS — I129 Hypertensive chronic kidney disease with stage 1 through stage 4 chronic kidney disease, or unspecified chronic kidney disease: Secondary | ICD-10-CM | POA: Diagnosis not present

## 2017-10-09 DIAGNOSIS — M25512 Pain in left shoulder: Secondary | ICD-10-CM | POA: Diagnosis not present

## 2017-10-09 DIAGNOSIS — E1165 Type 2 diabetes mellitus with hyperglycemia: Secondary | ICD-10-CM | POA: Diagnosis not present

## 2017-10-09 DIAGNOSIS — N182 Chronic kidney disease, stage 2 (mild): Secondary | ICD-10-CM | POA: Diagnosis not present

## 2017-11-27 DIAGNOSIS — D1801 Hemangioma of skin and subcutaneous tissue: Secondary | ICD-10-CM | POA: Diagnosis not present

## 2017-11-27 DIAGNOSIS — L570 Actinic keratosis: Secondary | ICD-10-CM | POA: Diagnosis not present

## 2017-12-10 ENCOUNTER — Ambulatory Visit (HOSPITAL_COMMUNITY)
Admission: RE | Admit: 2017-12-10 | Discharge: 2017-12-10 | Disposition: A | Discharge status: Home / Self Care | Payer: Medicare PPO | Source: Ambulatory Visit | Attending: Adult Health Nurse Practitioner | Admitting: Adult Health Nurse Practitioner

## 2017-12-10 ENCOUNTER — Other Ambulatory Visit (HOSPITAL_COMMUNITY): Payer: Self-pay | Admitting: Adult Health Nurse Practitioner

## 2017-12-10 DIAGNOSIS — M25512 Pain in left shoulder: Secondary | ICD-10-CM

## 2017-12-10 DIAGNOSIS — Z6832 Body mass index (BMI) 32.0-32.9, adult: Secondary | ICD-10-CM | POA: Diagnosis not present

## 2017-12-10 DIAGNOSIS — M19012 Primary osteoarthritis, left shoulder: Secondary | ICD-10-CM | POA: Diagnosis not present

## 2017-12-17 DIAGNOSIS — M7502 Adhesive capsulitis of left shoulder: Secondary | ICD-10-CM | POA: Diagnosis not present

## 2017-12-17 DIAGNOSIS — M25512 Pain in left shoulder: Secondary | ICD-10-CM | POA: Diagnosis not present

## 2017-12-30 ENCOUNTER — Ambulatory Visit (HOSPITAL_COMMUNITY): Payer: Medicare PPO | Attending: Orthopedic Surgery | Admitting: Specialist

## 2017-12-30 ENCOUNTER — Encounter (HOSPITAL_COMMUNITY): Payer: Self-pay | Admitting: Specialist

## 2017-12-30 ENCOUNTER — Other Ambulatory Visit: Payer: Self-pay

## 2017-12-30 DIAGNOSIS — R29898 Other symptoms and signs involving the musculoskeletal system: Secondary | ICD-10-CM | POA: Insufficient documentation

## 2017-12-30 DIAGNOSIS — M25512 Pain in left shoulder: Secondary | ICD-10-CM | POA: Diagnosis not present

## 2017-12-30 DIAGNOSIS — M25612 Stiffness of left shoulder, not elsewhere classified: Secondary | ICD-10-CM | POA: Diagnosis not present

## 2017-12-30 NOTE — Patient Instructions (Signed)
Complete the following exercises 2-3 times a day.  Doorway Stretch  Place each hand opposite each other on the doorway. (You can change where you feel the stretch by moving arms higher or lower.) Step through with one foot and bend front knee until a stretch is felt and hold. Step through with the opposite foot on the next rep. Hold for __10-15___ seconds. Repeat __2__times.     Scapular Retraction (Standing)   With arms at sides, pinch shoulder blades together. Repeat __10__ times per set. Do __1__ sets per session. Do __2__ sessions per day.  http://orth.exer.us/944   Copyright  VHI. All rights reserved.   Internal Rotation Across Back  Grab the end of a towel with your affected side, palm facing backwards. Grab the towel with your unaffected side and pull your affected hand across your back until you feel a stretch in the front of your shoulder. If you feel pain, pull just to the pain, do not pull through the pain. Hold. Return your affected arm to your side. Try to keep your hand/arm close to your body during the entire movement.     Hold for 10-15 seconds. Complete 2 times.        Posterior Capsule Stretch   Stand or sit, one arm across body so hand rests over opposite shoulder. Gently push on crossed elbow with other hand until stretch is felt in shoulder of crossed arm. Hold _10-15__ seconds.  Repeat _2__ times per session. Do ___ sessions per day.   Wall Flexion  Slide your arm up the wall or door frame until a stretch is felt in your shoulder . Hold for 10-15 seconds. Complete 2 times     Shoulder Abduction Stretch  Stand side ways by a wall with affected up on wall. Gently step in toward wall to feel stretch. Hold for 10-15 seconds. Complete 2 times.   Table Stretch: External Rotation    Sit with left arm on table. Lean forward until a stretch is felt in shoulder. Hold ____ seconds. Repeat ____ times. Do ____ sessions per day.  http://gt2.exer.us/106     Copyright  VHI. All rights reserved.

## 2017-12-30 NOTE — Therapy (Signed)
[facility] [address] [place], [place], [address] Phone: [phone]   Fax:  [phone]  Occupational Therapy Evaluation  Patient Details  Name: Alisha GroveMildred M Sponsel MRN: [id number] Date of Birth: 10-25-1950 Referring Provider: [name]   Encounter Date: 12/30/2017  OT End of Session - 12/30/17 1621    Visit Number  1    Number of Visits  8    Date for OT Re-Evaluation  01/29/18    Authorization Type  [employer]     OT Start Time  1430    OT Stop Time  1515    OT Time Calculation (min)  45 min    Activity Tolerance  Patient tolerated treatment well    Behavior During Therapy  [facility] for tasks assessed/performed       Past Medical History:  Diagnosis Date  . Dermatitis   . Diabetes mellitus   . Hyperlipidemia   . Hypertension   . IGT (impaired glucose tolerance)   . Obesity   . Spinal stenosis   . Urinary frequency 02/28/2015  . UTI (lower urinary tract infection) 02/28/2015    Past Surgical History:  Procedure Laterality Date  . right breast biopsy calcification  benign  2003  . spinal ablation  11/02/14  . TUBAL LIGATION      There were no vitals filed for this visit.  Subjective Assessment - 12/30/17 1614    Subjective   S:  I got a shot a few weeks ago, my arm is doing much better.  It feels weaker than normal.      Pertinent History  [name] reports having a gradual onset of pain and decreased mobility in her left shoulder over the past several month.  She was referred to [name] by her primary care MD.  X rays were negative.  Patient received a cortisone injection, which has improved, but not dissipated her symptoms.  Patient now referred to occupational therapy for evaluation and treatment.     Patient Stated Goals  I want to use my arm like I did before this started.     Currently in Pain?  Yes    Pain Score  2     Pain Location  Shoulder    Pain Orientation  Left;Posterior    Pain Descriptors /  Indicators  Aching    Pain Type  Acute pain    Pain Radiating Towards  shoulder joint    Pain Onset  1 to 4 weeks ago    Pain Frequency  Intermittent    Aggravating Factors   internal rotation and overhead reaching    Pain Relieving Factors  rest    Effect of Pain on Daily Activities  minimal        [facility] OT Assessment - 12/30/17 0001      Assessment   Medical Diagnosis  Left Shoulder Adhesive Capsulitis    Referring Provider  [name]    Onset Date/Surgical Date  -- 4 months ago    Hand Dominance  Right    Next MD Visit  one month      Precautions   Precautions  None      Restrictions   Weight Bearing Restrictions  No      Balance Screen   Has the patient fallen in the past 6 months  No    Has the patient had a decrease in activity level because of a fear of falling?   No  Is the patient reluctant to leave their home because of a fear of falling?   No      Home  Environment   Family/patient expects to be discharged to:  Private residence    Lives With  Significant other      Prior Function   Level of Independence  Independent    Vocation  Retired    Leisure  reading and working in her yard       ADL   ADL comments  patient is unable to reach behind her back to fasten her bra.  She is having diffculty reaching overhead and lifting heavy items overhead       Written Expression   Dominant Hand  Right      Vision - History   Baseline Vision  No visual deficits      Cognition   Overall Cognitive Status  Within Functional Limits for tasks assessed      Observation/Other Assessments   Focus on Therapeutic Outcomes (FOTO)   83/100      Sensation   Light Touch  Appears Intact      Coordination   Gross Motor Movements are Fluid and Coordinated  Yes    Fine Motor Movements are Fluid and Coordinated  Yes      ROM / Strength   AROM / PROM / Strength  AROM;PROM;Strength      Palpation   Palpation comment  minimal fascial restrictions in left shoulder and  scapular region      AROM   Overall AROM Comments  assessed in seated    AROM Assessment Site  Shoulder    Right/Left Shoulder  Left    Left Shoulder Flexion  125 Degrees    Left Shoulder ABduction  120 Degrees    Left Shoulder Internal Rotation  85 Degrees    Left Shoulder External Rotation  35 Degrees      PROM   Overall PROM Comments  shoulder p/rom is WNL      Strength   Strength Assessment Site  Shoulder    Right/Left Shoulder  Left    Left Shoulder Flexion  4+/5    Left Shoulder ABduction  4+/5    Left Shoulder Internal Rotation  4+/5    Left Shoulder External Rotation  4+/5               OT Treatments/Exercises (OP) - 12/30/17 0001      Manual Therapy   Manual Therapy  Myofascial release    Manual therapy comments  manual therapy completed seperately from all other interventions this date    Myofascial Release  myofascial release and manual stretching to left upper arm, scapular and shoulder region to decrease pain and restrictions and improve pain free moblity in left shoulder region and scapular region             OT Education - 12/30/17 1621    Education Details  educated on shoulder stretches    Person(s) Educated  Patient    Methods  Explanation;Handout;Demonstration    Comprehension  Verbalized understanding;Returned demonstration       OT Short Term Goals - 12/30/17 1632      OT SHORT TERM GOAL #1   Title  Patient will be educated on HEP for improved range of motion and strength in her left shoulder.     Time  4    Period  Weeks    Status  New    Target Date  01/29/18  OT SHORT TERM GOAL #2   Title  Patient will improve left shoulder A/ROM to WNL for improved ability to fasten her bra and reach into overhead cabinets.     Time  4    Period  Weeks    Status  New      OT SHORT TERM GOAL #3   Title  Patient will improve left shoulder strength to 5/5 for improved ability to lift items when working in her yard.     Time  4    Period   Weeks    Status  New      OT SHORT TERM GOAL #4   Title  Patient will decrease pain in her left shoulder to 1/10 or less when fastening her bra or reaching overhead into a cabinet.     Time  4    Period  Weeks    Status  New      OT SHORT TERM GOAL #5   Title  Patient will decrease fascial restrictions to trace in her left shoulder region for improved mobility required for ADLs/IADLs.    Time  4    Period  Weeks    Status  New               Plan - 12/30/17 1622    Clinical Impression Statement  A:  Patient is a 67 year old female with medical history significant for arthritis and back pain.  Patient has been experiencing increased pain and decreased range of motion in her left shoulder for the past several months.  Patient states that she has difficulty reaching overhead and behind her back for functional activities.  Patient states that a recent cortisone injection has lessened her symptoms.  Patient continues to have decreased range of motion, strength and increased pain and restrictions causing her to be unable to complete her B/IADLs and leisure activities as she would wish to do.     Occupational Profile and client history currently impacting functional performance  motivation, general good health    Occupational performance deficits (Please refer to evaluation for details):  ADL's;IADL's;Rest and Sleep;Leisure    Rehab Potential  Good    Current Impairments/barriers affecting progress:  n/a    OT Frequency  2x / week    OT Duration  4 weeks    OT Treatment/Interventions  Self-care/ADL training;Electrical Stimulation;Therapeutic exercise;Patient/family education;Neuromuscular education;Moist Heat;Energy conservation;Therapeutic activities;Passive range of motion;Manual Therapy;Cryotherapy;Ultrasound;DME and/or AE instruction    Plan  P:  Skilled OT intervention to decrease pain, increase range of motion and strength to WNL for return to prior level of independence of ADLs.   Next session:  review Plan of Care, review use of tennis ball for self massage, begin AA/ROM and A/ROM, progressing as tolerated.     Clinical Decision Making  Limited treatment options, no task modification necessary    OT Home Exercise Plan  shoulder stretches     Consulted and Agree with Plan of Care  Patient       Patient will benefit from skilled therapeutic intervention in order to improve the following deficits and impairments:  Pain, Increased fascial restrictions, Increased muscle spasms, Decreased strength, Decreased range of motion, Impaired perceived functional ability, Impaired UE functional use  Visit Diagnosis: Acute pain of left shoulder  Other symptoms and signs involving the musculoskeletal system  Stiffness of left shoulder, not elsewhere classified    Problem List Patient Active Problem List   Diagnosis Date Noted  . Urinary frequency 02/28/2015  .  UTI (lower urinary tract infection) 02/28/2015  . Lumbago 03/30/2013  . Gluteal pain 03/30/2013  . Hip pain, right 06/09/2012  . Cystitis, acute 11/09/2011  . Hip pain 10/23/2011  . SI (sacroiliac) joint inflammation (HCC) 10/23/2011  . Depressive disorder, not elsewhere classified 10/08/2010  . Prediabetes 08/16/2009  . FATIGUE 03/10/2009  . VARICOSE VEINS LOWER EXTREMITIES W/OTH COMPS 12/09/2008  . HYPERLIPIDEMIA 11/27/2007  . OBESITY 11/27/2007  . HYPERTENSION 11/27/2007  . DERMATITIS 11/27/2007    Shirlean Mylar, MHA, OTR/L [phone]  12/30/2017, 4:45 PM  [facility] [address] [place], [place], [address] Phone: [phone]   Fax:  [phone]  Name: BRENLEIGH COLLET MRN: [id number] Date of Birth: 1951-04-20

## 2018-01-01 ENCOUNTER — Encounter (HOSPITAL_COMMUNITY): Payer: Self-pay | Admitting: Occupational Therapy

## 2018-01-01 ENCOUNTER — Ambulatory Visit (HOSPITAL_COMMUNITY): Payer: Medicare PPO | Admitting: Occupational Therapy

## 2018-01-01 DIAGNOSIS — M25512 Pain in left shoulder: Secondary | ICD-10-CM | POA: Diagnosis not present

## 2018-01-01 DIAGNOSIS — R29898 Other symptoms and signs involving the musculoskeletal system: Secondary | ICD-10-CM

## 2018-01-01 DIAGNOSIS — M25612 Stiffness of left shoulder, not elsewhere classified: Secondary | ICD-10-CM | POA: Diagnosis not present

## 2018-01-01 NOTE — Therapy (Signed)
[facility] [address] [place], [place], [address] Phone: [phone]   Fax:  [phone]  Occupational Therapy Treatment  Patient Details  Name: [name] MRN: [id number] Date of Birth: 16-Nov-1950 Referring Provider: Dr. Francena Hanly   Encounter Date: 01/01/2018  OT End of Session - 01/01/18 1038    Visit Number  2    Number of Visits  8    Date for OT Re-Evaluation  01/29/18    Authorization Type  [employer]     OT Start Time  [phone]    OT Stop Time  1030    OT Time Calculation (min)  39 min    Activity Tolerance  Patient tolerated treatment well    Behavior During Therapy  [facility] for tasks assessed/performed       Past Medical History:  Diagnosis Date  . Dermatitis   . Diabetes mellitus   . Hyperlipidemia   . Hypertension   . IGT (impaired glucose tolerance)   . Obesity   . Spinal stenosis   . Urinary frequency 02/28/2015  . UTI (lower urinary tract infection) 02/28/2015    Past Surgical History:  Procedure Laterality Date  . right breast biopsy calcification  benign  2003  . spinal ablation  11/02/14  . TUBAL LIGATION      There were no vitals filed for this visit.  Subjective Assessment - 01/01/18 0952    Subjective   S: It's been sore the last few days.     Currently in Pain?  Yes    Pain Score  4     Pain Location  Shoulder    Pain Orientation  Left    Pain Descriptors / Indicators  Sore    Pain Type  Acute pain    Pain Radiating Towards  n/a    Pain Onset  1 to 4 weeks ago    Pain Frequency  Intermittent    Aggravating Factors   internal rotation and overhead reaching    Pain Relieving Factors  rest    Effect of Pain on Daily Activities  minimal         [facility] OT Assessment - 01/01/18 0952      Assessment   Medical Diagnosis  Left Shoulder Adhesive Capsulitis      Precautions   Precautions  None               OT Treatments/Exercises (OP) - 01/01/18 0954      Exercises   Exercises  Shoulder      Shoulder Exercises: Supine   Protraction  PROM;5 reps;AROM;12 reps    Horizontal ABduction  PROM;5 reps;AROM;12 reps    External Rotation  PROM;5 reps;AROM;12 reps    Internal Rotation  PROM;5 reps;AROM;12 reps    Flexion  PROM;5 reps;AROM;12 reps    ABduction  PROM;5 reps;AROM;12 reps      Shoulder Exercises: Standing   Protraction  AROM;12 reps    Horizontal ABduction  AROM;12 reps    External Rotation  AROM;12 reps    Internal Rotation  AROM;12 reps    Flexion  AROM;12 reps    ABduction  AROM;12 reps    Extension  Theraband;12 reps    Theraband Level (Shoulder Extension)  Level 2 (Red)    Row  Theraband;12 reps    Theraband Level (Shoulder Row)  Level 2 (Red)    Retraction  Theraband;12 reps    Theraband Level (Shoulder Retraction)  Level 2 (Red)  Shoulder Exercises: ROM/Strengthening   X to V Arms  12X    Proximal Shoulder Strengthening, Supine  10X each no rest breaks    Ball on Wall  1' flexion 1' abduction      Shoulder Exercises: Stretch   Internal Rotation Stretch  1 rep 20" with towel horizontal      Manual Therapy   Manual Therapy  Myofascial release    Manual therapy comments  manual therapy completed seperately from all other interventions this date    Myofascial Release  myofascial release and manual stretching to left upper arm, scapular and shoulder region to decrease pain and restrictions and improve pain free moblity in left shoulder region and scapular region                OT Short Term Goals - 01/01/18 1051      OT SHORT TERM GOAL #1   Title  Patient will be educated on HEP for improved range of motion and strength in her left shoulder.     Time  4    Period  Weeks    Status  On-going      OT SHORT TERM GOAL #2   Title  Patient will improve left shoulder A/ROM to WNL for improved ability to fasten her bra and reach into overhead cabinets.     Time  4    Period  Weeks    Status  On-going      OT SHORT TERM  GOAL #3   Title  Patient will improve left shoulder strength to 5/5 for improved ability to lift items when working in her yard.     Time  4    Period  Weeks    Status  On-going      OT SHORT TERM GOAL #4   Title  Patient will decrease pain in her left shoulder to 1/10 or less when fastening her bra or reaching overhead into a cabinet.     Time  4    Period  Weeks    Status  On-going      OT SHORT TERM GOAL #5   Title  Patient will decrease fascial restrictions to trace in her left shoulder region for improved mobility required for ADLs/IADLs.    Time  4    Period  Weeks    Status  On-going               Plan - 01/01/18 1038    Clinical Impression Statement  A: Initiated manual therapy, passive stretching, A/ROM, and scapular strengthening and stability this session. Pt with pain at end range during stretching, able to reach ROM >75%. Verbal cuing for form and technique during exercises. Reviewed IR stretch as pt was having difficulty at home.     Plan  P: Continue with manual therapy and A/ROM, add pvc pipe slide, update HEP for A/ROM. Follow up on IR stretch       Patient will benefit from skilled therapeutic intervention in order to improve the following deficits and impairments:  Pain, Increased fascial restrictions, Increased muscle spasms, Decreased strength, Decreased range of motion, Impaired perceived functional ability, Impaired UE functional use  Visit Diagnosis: Acute pain of left shoulder  Other symptoms and signs involving the musculoskeletal system  Stiffness of left shoulder, not elsewhere classified    Problem List Patient Active Problem List   Diagnosis Date Noted  . Urinary frequency 02/28/2015  . UTI (lower urinary tract infection) 02/28/2015  . Lumbago  03/30/2013  . Gluteal pain 03/30/2013  . Hip pain, right 06/09/2012  . Cystitis, acute 11/09/2011  . Hip pain 10/23/2011  . SI (sacroiliac) joint inflammation (HCC) 10/23/2011  . Depressive  disorder, not elsewhere classified 10/08/2010  . Prediabetes 08/16/2009  . FATIGUE 03/10/2009  . VARICOSE VEINS LOWER EXTREMITIES W/OTH COMPS 12/09/2008  . HYPERLIPIDEMIA 11/27/2007  . OBESITY 11/27/2007  . HYPERTENSION 11/27/2007  . DERMATITIS 11/27/2007   Ezra SitesLeslie Caitlyn Buchanan, OTR/L  [phone] 01/01/2018, 10:52 AM  [facility] [address] [place], [place], [address] Phone: [phone]   Fax:  [phone]  Name: Suella GroveMildred M Mcconnell MRN: [id number] Date of Birth: 03/07/1951

## 2018-01-07 ENCOUNTER — Encounter (HOSPITAL_COMMUNITY): Payer: Self-pay

## 2018-01-07 ENCOUNTER — Ambulatory Visit (HOSPITAL_COMMUNITY): Payer: Medicare PPO | Attending: Orthopedic Surgery

## 2018-01-07 DIAGNOSIS — R29898 Other symptoms and signs involving the musculoskeletal system: Secondary | ICD-10-CM | POA: Diagnosis not present

## 2018-01-07 DIAGNOSIS — M25612 Stiffness of left shoulder, not elsewhere classified: Secondary | ICD-10-CM | POA: Diagnosis not present

## 2018-01-07 DIAGNOSIS — M25512 Pain in left shoulder: Secondary | ICD-10-CM | POA: Insufficient documentation

## 2018-01-07 NOTE — Patient Instructions (Signed)

## 2018-01-07 NOTE — Therapy (Signed)
[facility] [address] [place], [place], [address] Phone: [phone]   Fax:  [phone]  Occupational Therapy Treatment  Patient Details  Name: [name] MRN: [id number] Date of Birth: 1950-12-22 Referring Provider: [name]   Encounter Date: 01/07/2018  OT End of Session - 01/07/18 1402    Visit Number  3    Number of Visits  8    Date for OT Re-Evaluation  01/29/18    Authorization Type  [employer]     OT Start Time  1349    OT Stop Time  1430    OT Time Calculation (min)  41 min    Activity Tolerance  Patient tolerated treatment well    Behavior During Therapy  [facility] for tasks assessed/performed       Past Medical History:  Diagnosis Date  . Dermatitis   . Diabetes mellitus   . Hyperlipidemia   . Hypertension   . IGT (impaired glucose tolerance)   . Obesity   . Spinal stenosis   . Urinary frequency 02/28/2015  . UTI (lower urinary tract infection) 02/28/2015    Past Surgical History:  Procedure Laterality Date  . right breast biopsy calcification  benign  2003  . spinal ablation  11/02/14  . TUBAL LIGATION      There were no vitals filed for this visit.  Subjective Assessment - 01/07/18 1400    Subjective   S: I'm just a little sore from picking up a 60# watermelon from the bottom of a container. That's something I couldn't do before starting therapy.     Currently in Pain?  No/denies         [facility] - 01/07/18 1402      Assessment   Medical Diagnosis  Left Shoulder Adhesive Capsulitis      Precautions   Precautions  None               OT Treatments/Exercises (OP) - 01/07/18 1402      Exercises   Exercises  Shoulder      Shoulder Exercises: Supine   Protraction  PROM;5 reps;AROM;12 reps    Horizontal ABduction  PROM;5 reps;AROM;12 reps    External Rotation  PROM;5 reps;AROM;12 reps    Internal Rotation  PROM;5 reps;AROM;12 reps    Flexion  PROM;5 reps;AROM;12  reps    ABduction  PROM;5 reps;AROM;12 reps      Shoulder Exercises: Standing   Protraction  Strengthening;12 reps    Protraction Weight (lbs)  1    Horizontal ABduction  Strengthening;12 reps    Horizontal ABduction Weight (lbs)  1    External Rotation  Strengthening;12 reps    External Rotation Weight (lbs)  1    Internal Rotation  Strengthening;12 reps    Internal Rotation Weight (lbs)  1    Flexion  Strengthening;12 reps    Shoulder Flexion Weight (lbs)  1    ABduction  Strengthening;12 reps    Shoulder ABduction Weight (lbs)  1      Shoulder Exercises: ROM/Strengthening   UBE (Upper Arm Bike)  Level 2 forward 2' reverse 2'    X to V Arms  10X with 1#    Proximal Shoulder Strengthening, Supine  12X each no rest breaks    Proximal Shoulder Strengthening, Seated  10X with 1# no rest breaks    Ball on Wall  1' flexion 1' abduction      Manual Therapy   Manual  Therapy  Myofascial release    Manual therapy comments  manual therapy completed seperately from all other interventions this date    Myofascial Release  myofascial release and manual stretching to left upper arm, scapular and shoulder region to decrease pain and restrictions and improve pain free moblity in left shoulder region and scapular region              OT Education - 01/07/18 1400    Education Details  strengthening shoulder exercises    Person(s) Educated  Patient    Methods  Explanation;Demonstration;Verbal cues;Handout    Comprehension  Returned demonstration;Verbalized understanding       OT Short Term Goals - 01/01/18 1051      OT SHORT TERM GOAL #1   Title  Patient will be educated on HEP for improved range of motion and strength in her left shoulder.     Time  4    Period  Weeks    Status  On-going      OT SHORT TERM GOAL #2   Title  Patient will improve left shoulder A/ROM to WNL for improved ability to fasten her bra and reach into overhead cabinets.     Time  4    Period  Weeks     Status  On-going      OT SHORT TERM GOAL #3   Title  Patient will improve left shoulder strength to 5/5 for improved ability to lift items when working in her yard.     Time  4    Period  Weeks    Status  On-going      OT SHORT TERM GOAL #4   Title  Patient will decrease pain in her left shoulder to 1/10 or less when fastening her bra or reaching overhead into a cabinet.     Time  4    Period  Weeks    Status  On-going      OT SHORT TERM GOAL #5   Title  Patient will decrease fascial restrictions to trace in her left shoulder region for improved mobility required for ADLs/IADLs.    Time  4    Period  Weeks    Status  On-going               Plan - 01/07/18 1402    Clinical Impression Statement  A: Updated HEP to include strengthening shoulder exercises. VC needed for form and technique. Added UBE bike at end of session to continue focusing on shoulder strength and stability. Ball on the wall posed to be slightly difficult today due to shoulder fatigue.     Plan  P: Follow up on HEP. Add cybex row and press. Add Y arms lift off.     Consulted and Agree with Plan of Care  Patient       Patient will benefit from skilled therapeutic intervention in order to improve the following deficits and impairments:  Pain, Increased fascial restrictions, Increased muscle spasms, Decreased strength, Decreased range of motion, Impaired perceived functional ability, Impaired UE functional use  Visit Diagnosis: Acute pain of left shoulder  Other symptoms and signs involving the musculoskeletal system  Stiffness of left shoulder, not elsewhere classified    Problem List Patient Active Problem List   Diagnosis Date Noted  . Urinary frequency 02/28/2015  . UTI (lower urinary tract infection) 02/28/2015  . Lumbago 03/30/2013  . Gluteal pain 03/30/2013  . Hip pain, right 06/09/2012  . Cystitis, acute 11/09/2011  .  Hip pain 10/23/2011  . SI (sacroiliac) joint inflammation (HCC)  10/23/2011  . Depressive disorder, not elsewhere classified 10/08/2010  . Prediabetes 08/16/2009  . FATIGUE 03/10/2009  . VARICOSE VEINS LOWER EXTREMITIES W/OTH COMPS 12/09/2008  . HYPERLIPIDEMIA 11/27/2007  . OBESITY 11/27/2007  . HYPERTENSION 11/27/2007  . DERMATITIS 11/27/2007   Limmie Patricia, OTR/L,CBIS  [phone]  01/07/2018, 2:47 PM  [facility] [address] [place], [place], [address] Phone: [phone]   Fax:  [phone]  Name: [name] MRN: [id number] Date of Birth: [birth year]

## 2018-01-09 ENCOUNTER — Ambulatory Visit (HOSPITAL_COMMUNITY): Payer: Medicare PPO | Admitting: Occupational Therapy

## 2018-01-15 ENCOUNTER — Encounter (HOSPITAL_COMMUNITY): Payer: Self-pay

## 2018-01-15 ENCOUNTER — Ambulatory Visit (HOSPITAL_COMMUNITY): Payer: Medicare PPO

## 2018-01-15 DIAGNOSIS — M25612 Stiffness of left shoulder, not elsewhere classified: Secondary | ICD-10-CM

## 2018-01-15 DIAGNOSIS — R29898 Other symptoms and signs involving the musculoskeletal system: Secondary | ICD-10-CM

## 2018-01-15 DIAGNOSIS — M25512 Pain in left shoulder: Secondary | ICD-10-CM | POA: Diagnosis not present

## 2018-01-15 NOTE — Therapy (Signed)
[facility] [address] [place], [place], [address] Phone: [phone]   Fax:  [phone]  Occupational Therapy Treatment  Patient Details  Name: [name] MRN: [id number] Date of Birth: 1951/05/27 Referring Provider: Dr. Francena Hanly   Encounter Date: 01/15/2018  OT End of Session - 01/15/18 1356    Visit Number  4    Number of Visits  8    Date for OT Re-Evaluation  01/29/18    Authorization Type  [employer]     OT Start Time  1305    OT Stop Time  1345    OT Time Calculation (min)  40 min    Activity Tolerance  Patient tolerated treatment well    Behavior During Therapy  [facility] for tasks assessed/performed       Past Medical History:  Diagnosis Date  . Dermatitis   . Diabetes mellitus   . Hyperlipidemia   . Hypertension   . IGT (impaired glucose tolerance)   . Obesity   . Spinal stenosis   . Urinary frequency 02/28/2015  . UTI (lower urinary tract infection) 02/28/2015    Past Surgical History:  Procedure Laterality Date  . right breast biopsy calcification  benign  2003  . spinal ablation  11/02/14  . TUBAL LIGATION      There were no vitals filed for this visit.  Subjective Assessment - 01/15/18 1308    Currently in Pain?  Yes    Pain Score  5     Pain Location  Shoulder    Pain Orientation  Left    Pain Descriptors / Indicators  Constant;Sore;Aching;Throbbing    Pain Type  Acute pain    Pain Radiating Towards  down the arm to elbow    Pain Onset  Yesterday    Pain Frequency  Constant    Aggravating Factors   N/A    Pain Relieving Factors  Postioning and pain meds    Effect of Pain on Daily Activities  Pushes through the pain    Multiple Pain Sites  No         [facility] OT Assessment - 01/15/18 1324      Assessment   Medical Diagnosis  Left Shoulder Adhesive Capsulitis      Precautions   Precautions  None               OT Treatments/Exercises (OP) - 01/15/18 1319      Exercises    Exercises  Shoulder      Shoulder Exercises: Supine   Protraction  PROM;5 reps;Strengthening;12 reps    Protraction Weight (lbs)  1    Horizontal ABduction  PROM;5 reps;Strengthening;12 reps    Horizontal ABduction Weight (lbs)  1    External Rotation  PROM;5 reps;Strengthening;12 reps    External Rotation Weight (lbs)  1    Internal Rotation  PROM;5 reps;Strengthening;12 reps    Internal Rotation Weight (lbs)  1    Flexion  PROM;5 reps;Strengthening;12 reps    Shoulder Flexion Weight (lbs)  1    ABduction  PROM;5 reps;Strengthening;12 reps    Shoulder ABduction Weight (lbs)  1      Shoulder Exercises: Standing   Protraction  Strengthening;12 reps    Protraction Weight (lbs)  1    Horizontal ABduction  Strengthening;12 reps    Horizontal ABduction Weight (lbs)  1    External Rotation  Strengthening;12 reps    External Rotation Weight (lbs)  1  Internal Rotation  Strengthening;12 reps    Internal Rotation Weight (lbs)  1    Flexion  Strengthening;12 reps    Shoulder Flexion Weight (lbs)  1    ABduction  Strengthening;12 reps    Shoulder ABduction Weight (lbs)  1      Shoulder Exercises: ROM/Strengthening   Cybex Press  10 reps   10#   X to V Arms  10X with 1#    Proximal Shoulder Strengthening, Supine  12X with 1# with no rest breaks    Other ROM/Strengthening Exercises  Y arms lift off; 10X      Manual Therapy   Manual Therapy  Myofascial release    Manual therapy comments  manual therapy completed seperately from all other interventions this date    Myofascial Release  myofascial release and manual stretching to left upper arm, scapular and shoulder region to decrease pain and restrictions and improve pain free moblity in left shoulder region and scapular region                OT Short Term Goals - 01/01/18 1051      OT SHORT TERM GOAL #1   Title  Patient will be educated on HEP for improved range of motion and strength in her left shoulder.     Time  4     Period  Weeks    Status  On-going      OT SHORT TERM GOAL #2   Title  Patient will improve left shoulder A/ROM to WNL for improved ability to fasten her bra and reach into overhead cabinets.     Time  4    Period  Weeks    Status  On-going      OT SHORT TERM GOAL #3   Title  Patient will improve left shoulder strength to 5/5 for improved ability to lift items when working in her yard.     Time  4    Period  Weeks    Status  On-going      OT SHORT TERM GOAL #4   Title  Patient will decrease pain in her left shoulder to 1/10 or less when fastening her bra or reaching overhead into a cabinet.     Time  4    Period  Weeks    Status  On-going      OT SHORT TERM GOAL #5   Title  Patient will decrease fascial restrictions to trace in her left shoulder region for improved mobility required for ADLs/IADLs.    Time  4    Period  Weeks    Status  On-going               Plan - 01/15/18 1356    Clinical Impression Statement  A: Pt reports increased muscle soreness with new exercises. She was required to take pain medication today as she woke up in the middle of the night with pain/soreness. Added Y arms lift off and cybex press. VC for form and technique. patient's ROM is functional for all ranges during passive stretching with joint mobility limitations noted with external rotation.     Plan  P: Add cybex row. Add external and internal rotation with bodycraft.    Consulted and Agree with Plan of Care  Patient       Patient will benefit from skilled therapeutic intervention in order to improve the following deficits and impairments:  Pain, Increased fascial restrictions, Increased muscle spasms, Decreased strength, Decreased range of  motion, Impaired perceived functional ability, Impaired UE functional use  Visit Diagnosis: Acute pain of left shoulder  Other symptoms and signs involving the musculoskeletal system  Stiffness of left shoulder, not elsewhere  classified    Problem List Patient Active Problem List   Diagnosis Date Noted  . Urinary frequency 02/28/2015  . UTI (lower urinary tract infection) 02/28/2015  . Lumbago 03/30/2013  . Gluteal pain 03/30/2013  . Hip pain, right 06/09/2012  . Cystitis, acute 11/09/2011  . Hip pain 10/23/2011  . SI (sacroiliac) joint inflammation (HCC) 10/23/2011  . Depressive disorder, not elsewhere classified 10/08/2010  . Prediabetes 08/16/2009  . FATIGUE 03/10/2009  . VARICOSE VEINS LOWER EXTREMITIES W/OTH COMPS 12/09/2008  . HYPERLIPIDEMIA 11/27/2007  . OBESITY 11/27/2007  . HYPERTENSION 11/27/2007  . DERMATITIS 11/27/2007   Limmie PatriciaLaura Mady Oubre, OTR/L,CBIS  [phone]  01/15/2018, 1:58 PM  [facility] [address] [place], [place], [address] Phone: [phone]   Fax:  [phone]  Name: Suella GroveMildred M Berggren MRN: [id number] Date of Birth: 20-Apr-1951

## 2018-01-16 ENCOUNTER — Telehealth (HOSPITAL_COMMUNITY): Payer: Self-pay

## 2018-01-16 NOTE — Telephone Encounter (Signed)
Pt called to confirm apptment time for tomorrow  after call from phone tree. NF

## 2018-01-17 ENCOUNTER — Encounter (HOSPITAL_COMMUNITY): Payer: Self-pay

## 2018-01-17 ENCOUNTER — Ambulatory Visit (HOSPITAL_COMMUNITY): Payer: Medicare PPO

## 2018-01-17 DIAGNOSIS — R29898 Other symptoms and signs involving the musculoskeletal system: Secondary | ICD-10-CM

## 2018-01-17 DIAGNOSIS — M25512 Pain in left shoulder: Secondary | ICD-10-CM | POA: Diagnosis not present

## 2018-01-17 DIAGNOSIS — M25612 Stiffness of left shoulder, not elsewhere classified: Secondary | ICD-10-CM | POA: Diagnosis not present

## 2018-01-17 NOTE — Therapy (Signed)
[facility] [address] [place], [place], [address] Phone: [phone]   Fax:  [phone]  Occupational Therapy Treatment  Patient Details  Name: [name] MRN: [id number] Date of Birth: 1950-12-13 Referring Provider: [name]   Encounter Date: 01/17/2018  OT End of Session - 01/17/18 1334    Visit Number  5    Number of Visits  8    Date for OT Re-Evaluation  01/29/18    Authorization Type  [employer]     OT Start Time  1306    OT Stop Time  1345    OT Time Calculation (min)  39 min    Activity Tolerance  Patient tolerated treatment well    Behavior During Therapy  [facility] for tasks assessed/performed       Past Medical History:  Diagnosis Date  . Dermatitis   . Diabetes mellitus   . Hyperlipidemia   . Hypertension   . IGT (impaired glucose tolerance)   . Obesity   . Spinal stenosis   . Urinary frequency 02/28/2015  . UTI (lower urinary tract infection) 02/28/2015    Past Surgical History:  Procedure Laterality Date  . right breast biopsy calcification  benign  2003  . spinal ablation  11/02/14  . TUBAL LIGATION      There were no vitals filed for this visit.  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     Currently in Pain?  No/denies         [facility] - 01/17/18 1329      Assessment   Medical Diagnosis  Left Shoulder Adhesive Capsulitis      Precautions   Precautions  None               OT Treatments/Exercises (OP) - 01/17/18 1329      Exercises   Exercises  Shoulder      Shoulder Exercises: Supine   Protraction  PROM;5 reps    Horizontal ABduction  PROM;5 reps    External Rotation  PROM;5 reps    Internal Rotation  PROM;5 reps    Flexion  PROM;5 reps    ABduction  PROM;5 reps      Shoulder Exercises: Standing   Horizontal ABduction  Strengthening;12 reps    Horizontal ABduction Weight (lbs)  2     External Rotation  Strengthening;12 reps    External Rotation Weight (lbs)  2    Internal Rotation  Strengthening;12 reps    Internal Rotation Weight (lbs)  2    Flexion  Strengthening;12 reps    Shoulder Flexion Weight (lbs)  2    ABduction  Strengthening;12 reps    Shoulder ABduction Weight (lbs)  2      Shoulder Exercises: Therapy Ball   Right/Left  5 reps    Other Therapy Ball Exercises  Chest press, circles (both directions), flexion, holding green therapy ball; 10X      Shoulder Exercises: ROM/Strengthening   Other ROM/Strengthening Exercises  Y arms lift off; 2#; 10X    Other ROM/Strengthening Exercises  Bodycraft: (pulleys standing): row and retraction; 10X; 20#      Manual Therapy   Manual Therapy  Myofascial release    Manual therapy comments  manual therapy completed seperately from all other interventions this date    Myofascial Release  myofascial release and manual stretching to left upper arm, scapular  and shoulder region to decrease pain and restrictions and improve pain free moblity in left shoulder region and scapular region                OT Short Term Goals - 01/01/18 1051      OT SHORT TERM GOAL #1   Title  Patient will be educated on HEP for improved range of motion and strength in her left shoulder.     Time  4    Period  Weeks    Status  On-going      OT SHORT TERM GOAL #2   Title  Patient will improve left shoulder A/ROM to WNL for improved ability to fasten her bra and reach into overhead cabinets.     Time  4    Period  Weeks    Status  On-going      OT SHORT TERM GOAL #3   Title  Patient will improve left shoulder strength to 5/5 for improved ability to lift items when working in her yard.     Time  4    Period  Weeks    Status  On-going      OT SHORT TERM GOAL #4   Title  Patient will decrease pain in her left shoulder to 1/10 or less when fastening her bra or reaching overhead into a cabinet.     Time  4    Period  Weeks     Status  On-going      OT SHORT TERM GOAL #5   Title  Patient will decrease fascial restrictions to trace in her left shoulder region for improved mobility required for ADLs/IADLs.    Time  4    Period  Weeks    Status  On-going               Plan - 01/17/18 1353    Clinical Impression Statement  A: Discussed deficit areas with patient in order to focus therapy on specifit areas in order to prepare for disharge. patient reports that internal rotation (behind back) has gotten better but still is difficult. Patient also reports difficulty with shoulder endurance. Completed therapy ball circles and therapy ball exercises this session to focus on mentioned deficits. patient required VC for form and technique and for body awareness during exercises as to avoid compensatory movements and any strenous muscle movements.     Plan  P: Focus on internal rotation and shoulder endurance. Complete overhead shoulder endurance task. Ball on the wall with large pink ball (have patient push into ball during task and focus on scapular and shoulder stability).    Consulted and Agree with Plan of Care  Patient       Patient will benefit from skilled therapeutic intervention in order to improve the following deficits and impairments:  Pain, Increased fascial restrictions, Increased muscle spasms, Decreased strength, Decreased range of motion, Impaired perceived functional ability, Impaired UE functional use  Visit Diagnosis: Acute pain of left shoulder  Stiffness of left shoulder, not elsewhere classified  Other symptoms and signs involving the musculoskeletal system    Problem List Patient Active Problem List   Diagnosis Date Noted  . Urinary frequency 02/28/2015  . UTI (lower urinary tract infection) 02/28/2015  . Lumbago 03/30/2013  . Gluteal pain 03/30/2013  . Hip pain, right 06/09/2012  . Cystitis, acute 11/09/2011  . Hip pain 10/23/2011  . SI (sacroiliac) joint inflammation (HCC)  10/23/2011  . Depressive disorder, not elsewhere classified 10/08/2010  .  Prediabetes 08/16/2009  . FATIGUE 03/10/2009  . VARICOSE VEINS LOWER EXTREMITIES W/OTH COMPS 12/09/2008  . HYPERLIPIDEMIA 11/27/2007  . OBESITY 11/27/2007  . HYPERTENSION 11/27/2007  . DERMATITIS 11/27/2007   Limmie PatriciaLaura Keri Veale, OTR/L,CBIS  [phone] 01/17/2018, 1:58 PM  [facility] [address] [place], [place], [address] Phone: [phone]   Fax:  [phone]  Name: [name] MRN: [id number] Date of Birth: January 10, 1951

## 2018-01-20 ENCOUNTER — Telehealth (HOSPITAL_COMMUNITY): Payer: Self-pay

## 2018-01-20 ENCOUNTER — Ambulatory Visit (HOSPITAL_COMMUNITY): Payer: Medicare PPO

## 2018-01-20 NOTE — Telephone Encounter (Signed)
Pt cx today and r/s for tormorrow she had a tree fall on her home this weekend.

## 2018-01-21 ENCOUNTER — Encounter (HOSPITAL_COMMUNITY): Payer: Self-pay

## 2018-01-21 ENCOUNTER — Ambulatory Visit (HOSPITAL_COMMUNITY): Payer: Medicare PPO

## 2018-01-21 DIAGNOSIS — M25512 Pain in left shoulder: Secondary | ICD-10-CM

## 2018-01-21 DIAGNOSIS — R29898 Other symptoms and signs involving the musculoskeletal system: Secondary | ICD-10-CM

## 2018-01-21 DIAGNOSIS — M25612 Stiffness of left shoulder, not elsewhere classified: Secondary | ICD-10-CM

## 2018-01-21 NOTE — Therapy (Signed)
[place] [facility] [address] [place], [place], [address] Phone: [phone]   Fax:  [phone]  Occupational Therapy Treatment  Patient Details  Name: [name] MRN: [id number] Date of Birth: 1951-05-21 Referring Provider: [name]   Encounter Date: 01/21/2018  OT End of Session - 01/21/18 1149    Visit Number  6    Number of Visits  8    Date for OT Re-Evaluation  01/29/18    Authorization Type  [employer]     OT Start Time  0823    OT Stop Time  0905    OT Time Calculation (min)  42 min    Activity Tolerance  Patient tolerated treatment well    Behavior During Therapy  [facility] for [facility] assessed/performed       Past Medical History:  Diagnosis Date  . Dermatitis   . Diabetes mellitus   . Hyperlipidemia   . Hypertension   . IGT (impaired glucose tolerance)   . Obesity   . Spinal stenosis   . Urinary frequency 02/28/2015  . UTI (lower urinary tract infection) 02/28/2015    Past Surgical History:  Procedure Laterality Date  . right breast biopsy calcification  benign  2003  . spinal ablation  11/02/14  . TUBAL LIGATION      There were no vitals filed for this visit.  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     Currently in Pain?  Yes    Pain Score  2     Pain Location  Shoulder    Pain Orientation  Left    Pain Descriptors / Indicators  Constant;Sore    Pain Type  Acute pain    Pain Radiating Towards   n/a    Pain Onset  Yesterday    Pain Frequency  Constant    Aggravating Factors   Picking up melons yesterday from the truck bed    Pain Relieving Factors  positioning and pain meds    Effect of Pain on Daily Activities  pushes through the pain    Multiple Pain Sites  No         [facility] OT Assessment - 01/21/18 0841      Assessment   Medical Diagnosis  Left Shoulder Adhesive  Capsulitis      Precautions   Precautions  None               OT Treatments/Exercises (OP) - 01/21/18 0841      Exercises   Exercises  Shoulder      Shoulder Exercises: Supine   Protraction  PROM;5 reps    Horizontal ABduction  PROM;5 reps    External Rotation  PROM;5 reps    Internal Rotation  PROM;5 reps    Flexion  PROM;5 reps    ABduction  PROM;5 reps      Shoulder Exercises: Standing   Horizontal ABduction  Strengthening;12 reps    Horizontal ABduction Weight (lbs)  2    External Rotation  Strengthening;12 reps   simulate reaching for seatbelt   External Rotation Weight (lbs)  2    Internal Rotation  Strengthening;12 reps   rotate behind back   Internal Rotation Weight (lbs)  2    Flexion  Strengthening;12 reps    Shoulder Flexion Weight (lbs)  2  ABduction  Strengthening;12 reps    Shoulder ABduction Weight (lbs)  2      Functional Reaching Activities   High Level  Overhead shoulder endurance task completed with shower curtain. Pt unhooked rings and rehooked with 1 rest break      Manual Therapy   Manual Therapy  Myofascial release    Manual therapy comments  manual therapy completed seperately from all other interventions this date    Myofascial Release  myofascial release and manual stretching to left upper arm, scapular and shoulder region to decrease pain and restrictions and improve pain free moblity in left shoulder region and scapular region                OT Short Term Goals - 01/01/18 1051      OT SHORT TERM GOAL #1   Title  Patient will be educated on HEP for improved range of motion and strength in her left shoulder.     Time  4    Period  Weeks    Status  On-going      OT SHORT TERM GOAL #2   Title  Patient will improve left shoulder A/ROM to WNL for improved ability to fasten her bra and reach into overhead cabinets.     Time  4    Period  Weeks    Status  On-going      OT SHORT TERM GOAL #3   Title  Patient will improve  left shoulder strength to 5/5 for improved ability to lift items when working in her yard.     Time  4    Period  Weeks    Status  On-going      OT SHORT TERM GOAL #4   Title  Patient will decrease pain in her left shoulder to 1/10 or less when fastening her bra or reaching overhead into a cabinet.     Time  4    Period  Weeks    Status  On-going      OT SHORT TERM GOAL #5   Title  Patient will decrease fascial restrictions to trace in her left shoulder region for improved mobility required for ADLs/IADLs.    Time  4    Period  Weeks    Status  On-going               Plan - 01/21/18 1149    Clinical Impression Statement  A: Patient with min fascial restrictions in left medial fascial restrictions. Manual techniques completed to address with good results. Session focused on shoulder endurance during overhead task using shower curtain. Shoulder strengthening completed with handweights with VC for form and technique.     Plan  P: Continue with overhead task with shower curtain focusing on less rest breaks. Ball on the wall with large pink ball (have patient push into ball during task and focus on scapular and shoulder stability.     Consulted and Agree with Plan of Care  Patient       Patient will benefit from skilled therapeutic intervention in order to improve the following deficits and impairments:  Pain, Increased fascial restrictions, Increased muscle spasms, Decreased strength, Decreased range of motion, Impaired perceived functional ability, Impaired UE functional use  Visit Diagnosis: Acute pain of left shoulder  Stiffness of left shoulder, not elsewhere classified  Other symptoms and signs involving the musculoskeletal system    Problem List Patient Active Problem List   Diagnosis Date Noted  . Urinary frequency  02/28/2015  . UTI (lower urinary tract infection) 02/28/2015  . Lumbago 03/30/2013  . Gluteal pain 03/30/2013  . Hip pain, right 06/09/2012  .  Cystitis, acute 11/09/2011  . Hip pain 10/23/2011  . SI (sacroiliac) joint inflammation (HCC) 10/23/2011  . Depressive disorder, not elsewhere classified 10/08/2010  . Prediabetes 08/16/2009  . FATIGUE 03/10/2009  . VARICOSE VEINS LOWER EXTREMITIES W/OTH COMPS 12/09/2008  . HYPERLIPIDEMIA 11/27/2007  . OBESITY 11/27/2007  . HYPERTENSION 11/27/2007  . DERMATITIS 11/27/2007   Limmie Patricia, OTR/L,CBIS  [phone]  01/21/2018, 11:57 AM  [facility] [address] [place], [place], [address] Phone: [phone]   Fax:  [phone]  Name: [name] MRN: [id number] Date of Birth: Nov 20, 1950

## 2018-01-22 ENCOUNTER — Encounter (HOSPITAL_COMMUNITY): Payer: Self-pay

## 2018-01-22 ENCOUNTER — Ambulatory Visit (HOSPITAL_COMMUNITY): Payer: Medicare PPO

## 2018-01-22 DIAGNOSIS — M25612 Stiffness of left shoulder, not elsewhere classified: Secondary | ICD-10-CM | POA: Diagnosis not present

## 2018-01-22 DIAGNOSIS — M25512 Pain in left shoulder: Secondary | ICD-10-CM

## 2018-01-22 DIAGNOSIS — R29898 Other symptoms and signs involving the musculoskeletal system: Secondary | ICD-10-CM

## 2018-01-22 NOTE — Therapy (Signed)
[facility] [address] [place], [place], [address] Phone: [phone]   Fax:  [phone]  Occupational Therapy Treatment  Patient Details  Name: [name] MRN: [id number] Date of Birth: 05/05/1951 Referring Provider: Dr. Francena Hanly   Encounter Date: 01/22/2018  OT End of Session - 01/22/18 1459    Visit Number  7    Number of Visits  8    Date for OT Re-Evaluation  01/29/18    Authorization Type  [employer]     OT Start Time  1350    OT Stop Time  1430    OT Time Calculation (min)  40 min    Activity Tolerance  Patient tolerated treatment well    Behavior During Therapy  [facility] for tasks assessed/performed       Past Medical History:  Diagnosis Date  . Dermatitis   . Diabetes mellitus   . Hyperlipidemia   . Hypertension   . IGT (impaired glucose tolerance)   . Obesity   . Spinal stenosis   . Urinary frequency 02/28/2015  . UTI (lower urinary tract infection) 02/28/2015    Past Surgical History:  Procedure Laterality Date  . right breast biopsy calcification  benign  2003  . spinal ablation  11/02/14  . TUBAL LIGATION      There were no vitals filed for this visit.  Subjective Assessment - 01/22/18 1353    Currently in Pain?  Yes    Pain Score  8     Pain Location  Neck   shoulders   Pain Orientation  Left;Right;Posterior    Pain Descriptors / Indicators  Sore    Pain Type  Acute pain    Pain Radiating Towards  N/A    Pain Onset  Today    Pain Frequency  Constant    Aggravating Factors   Picking up melons this morning made the pain worse    Pain Relieving Factors  nothing today    Effect of Pain on Daily Activities  tries to push through    Multiple Pain Sites  No                   OT Treatments/Exercises (OP) - 01/22/18 1405      Exercises   Exercises  Shoulder;Neck      Neck Exercises: Stretches   Upper Trapezius Stretch  Right;Left;1 rep;30 seconds      Shoulder Exercises:  ROM/Strengthening   UBE (Upper Arm Bike)  Level 2 forward 2' reverse 2'   pace: 3.5-4.0   Other ROM/Strengthening Exercises  Bodycraft: (pulleys) row, extension, retraction; 12X; 20#      Shoulder Exercises: Stretch   Other Shoulder Stretches  Doorway stretch; 2 reps; 30 ssecond hold      Functional Reaching Activities   High Level  Patient finished shower curtain task from previous session focusing on overhead functional reaching.       Manual Therapy   Manual Therapy  Myofascial release    Manual therapy comments  manual therapy completed seperately from all other interventions this date    Myofascial Release  myofascial release to bilateral cervical regions and bilateral scapular and trapezius region to decrease pain and restrictions and improve pain free moblity in left shoulder region and scapular region                OT Short Term Goals - 01/01/18 1051      OT SHORT TERM GOAL #1  Title  Patient will be educated on HEP for improved range of motion and strength in her left shoulder.     Time  4    Period  Weeks    Status  On-going      OT SHORT TERM GOAL #2   Title  Patient will improve left shoulder A/ROM to WNL for improved ability to fasten her bra and reach into overhead cabinets.     Time  4    Period  Weeks    Status  On-going      OT SHORT TERM GOAL #3   Title  Patient will improve left shoulder strength to 5/5 for improved ability to lift items when working in her yard.     Time  4    Period  Weeks    Status  On-going      OT SHORT TERM GOAL #4   Title  Patient will decrease pain in her left shoulder to 1/10 or less when fastening her bra or reaching overhead into a cabinet.     Time  4    Period  Weeks    Status  On-going      OT SHORT TERM GOAL #5   Title  Patient will decrease fascial restrictions to trace in her left shoulder region for improved mobility required for ADLs/IADLs.    Time  4    Period  Weeks    Status  On-going                Plan - 01/22/18 1500    Clinical Impression Statement  A: Patient presents to clinic reporting increased soreness and pain in bilateral cervical and upper trapezius region. Pt reports that after moving cantelope this morning the pain was increased. Manual therapy completed to address fascial restrictions with good response. VC for form and technique were needed during exercise performance.     Plan  P: May need to do reassessment this session as patient reports that she sees the MD on 8/28. Unsure if appointment for MD is before or after OT appointment on the 28th. patient will let us know. If so review goals and progress. If patient is ready for discharge update HEP if needed. If no reassessment is needed, continue with manual techniques for fascial restrictions and focus on tasks for shoulder and scapular stabiility. Ball on the wall with large pink ball ( have patient push into ball during task to focus on scapular and shoulder stability.        Patient will benefit from skilled therapeutic intervention in order to improve the following deficits and impairments:  Pain, Increased fascial restrictions, Increased muscle spasms, Decreased strength, Decreased range of motion, Impaired perceived functional ability, Impaired UE functional use  Visit Diagnosis: Acute pain of left shoulder  Stiffness of left shoulder, not elsewhere classified  Other symptoms and signs involving the musculoskeletal system    Problem List Patient Active Problem List   Diagnosis Date Noted  . Urinary frequency 02/28/2015  . UTI (lower urinary tract infection) 02/28/2015  . Lumbago 03/30/2013  . Gluteal pain 03/30/2013  . Hip pain, right 06/09/2012  . Cystitis, acute 11/09/2011  . Hip pain 10/23/2011  . SI (sacroiliac) joint inflammation ([facility]) 10/23/2011  . Depressive disorder, not elsewhere classified 10/08/2010  . Prediabetes 08/16/2009  . FATIGUE 03/10/2009  . VARICOSE VEINS LOWER  EXTREMITIES W/OTH COMPS 12/09/2008  . HYPERLIPIDEMIA 11/27/2007  . OBESITY 11/27/2007  . HYPERTENSION 11/27/2007  . DERMATITIS 11/27/2007  Limmie PatriciaLaura Ashanti Ratti, OTR/L,CBIS  [phone]  01/22/2018, 3:09 PM  [facility] [address] [place], [place], [address] Phone: [phone]   Fax:  [phone]  Name: [name] MRN: [id number] Date of Birth: 04/01/1951

## 2018-01-27 ENCOUNTER — Telehealth (HOSPITAL_COMMUNITY): Payer: Self-pay | Admitting: Internal Medicine

## 2018-01-27 ENCOUNTER — Ambulatory Visit (HOSPITAL_COMMUNITY): Payer: Medicare PPO | Admitting: Specialist

## 2018-01-27 NOTE — Telephone Encounter (Signed)
01/27/18  pt left a message that her husband has to go to his heart dr today at 4411 and she is afraid they won't be back in time for her therapy... she does want to reschedule

## 2018-01-28 ENCOUNTER — Encounter (HOSPITAL_COMMUNITY): Payer: Self-pay | Admitting: Specialist

## 2018-01-28 ENCOUNTER — Ambulatory Visit (HOSPITAL_COMMUNITY): Payer: Medicare PPO | Admitting: Specialist

## 2018-01-28 DIAGNOSIS — M25512 Pain in left shoulder: Secondary | ICD-10-CM | POA: Diagnosis not present

## 2018-01-28 DIAGNOSIS — M25612 Stiffness of left shoulder, not elsewhere classified: Secondary | ICD-10-CM

## 2018-01-28 DIAGNOSIS — R29898 Other symptoms and signs involving the musculoskeletal system: Secondary | ICD-10-CM

## 2018-01-28 NOTE — Therapy (Addendum)
[name], [place], [address] Phone: [phone]   Fax:  [phone]  Occupational Therapy Treatment  Patient Details  Name: [name] MRN: [id number] Date of Birth: [birth year] Referring Provider: Dr. Justice Britain    Encounter Date: 01/28/2018  Progress Note Reporting Period 12/30/17 to 01/28/18 See note below for Objective Data and Assessment of Progress/Goals.       OT End of Session - 01/28/18 1210    Visit Number  8    Number of Visits  10    Date for OT Re-Evaluation  02/27/18    Authorization Type  [employer]     OT Start Time  1120    OT Stop Time  1200    OT Time Calculation (min)  40 min    Activity Tolerance  Patient tolerated treatment well    Behavior During Therapy  WFL for tasks assessed/performed       Past Medical History:  Diagnosis Date  . Dermatitis   . Diabetes mellitus   . Hyperlipidemia   . Hypertension   . IGT (impaired glucose tolerance)   . Obesity   . Spinal stenosis   . Urinary frequency 02/28/2015  . UTI (lower urinary tract infection) 02/28/2015    Past Surgical History:  Procedure Laterality Date  . right breast biopsy calcification  benign  2003  . spinal ablation  11/02/14  . TUBAL LIGATION      There were no vitals filed for this visit.  Subjective Assessment - 01/28/18 1209    Subjective   S:  I have been doing much better, however, with this colder weather it has been a bit more sore.  It really is sore when I reach behind my back.     Currently in Pain?  Yes    Pain Score  1     Pain Location  Shoulder    Pain Orientation  Left    Pain Descriptors / Indicators  Aching         [facility] OT Assessment - 01/28/18 0001      Assessment   Medical Diagnosis  Left Shoulder Adhesive Capsulitis    Referring Provider  Dr. Lennette Bihari Supple     Onset Date/Surgical Date  --   5 months ago     Precautions   Precautions  None      Restrictions   Weight  Bearing Restrictions  No      ADL   ADL comments  Patient is now able to reach overhead and lift items overhead without pain or difficulty, don and doff shirts, and complete cleaning tasks without pain.  SHe continues to have limitiations and pain with reaching behind her back and head during ADLs.       Observation/Other Assessments   Focus on Therapeutic Outcomes (FOTO)   87/100      AROM   Overall AROM Comments  assessed in seated - external and internal rotation with shoulder abducted, previously was with shoulder adducted     Left Shoulder Flexion  160 Degrees   125   Left Shoulder ABduction  180 Degrees   120   Left Shoulder Internal Rotation  60 Degrees   85   Left Shoulder External Rotation  60 Degrees   35     Strength   Left Shoulder Flexion  5/5   4+/5   Left Shoulder ABduction  5/5   4+/5   Left Shoulder Internal Rotation  5/5   4+/5   Left Shoulder External Rotation  5/5   4+/5              OT Treatments/Exercises (OP) - 01/28/18 0001      Exercises   Exercises  Shoulder      Shoulder Exercises: Stretch   Internal Rotation Stretch  1 rep    External Rotation Stretch  1 rep;10 seconds    Star Gazer Stretch  1 rep;10 seconds    Other Shoulder Stretches  ball around back and reach       Manual Therapy   Manual Therapy  Myofascial release    Manual therapy comments  manual therapy completed seperately from all other interventions this date    Myofascial Release  myofascial release to left scapular and trapezius region to decrease pain and restrictions and improve pain free moblity in left shoulder region and scapular region , added gentle manual distraction to LUE this date with improved pain relief after technique              OT Education - 01/28/18 1210    Education Details  stretches and activities to focus on improved functional external and internal rotation (with shoulder abducted)    Person(s) Educated  Patient    Methods   Explanation;Handout    Comprehension  Verbalized understanding;Returned demonstration       OT Short Term Goals - 01/28/18 1219      OT SHORT TERM GOAL #1   Title  Patient will be educated on HEP for improved range of motion and strength in her left shoulder.     Time  4    Period  Weeks    Status  On-going      OT SHORT TERM GOAL #2   Title  Patient will improve left shoulder A/ROM to WNL for improved ability to fasten her bra and reach into overhead cabinets.     Time  4    Period  Weeks    Status  On-going      OT SHORT TERM GOAL #3   Title  Patient will improve left shoulder strength to 5/5 for improved ability to lift items when working in her yard.     Time  4    Period  Weeks    Status  Achieved      OT SHORT TERM GOAL #4   Title  Patient will decrease pain in her left shoulder to 1/10 or less when fastening her bra or reaching overhead into a cabinet.     Time  4    Period  Weeks    Status  Achieved      OT SHORT TERM GOAL #5   Title  Patient will decrease fascial restrictions to trace in her left shoulder region for improved mobility required for ADLs/IADLs.    Time  4    Period  Weeks    Status  Achieved               Plan - 01/28/18 1211    Clinical Impression Statement  A:  [name] has made excellent progress towards her goals in occupational therapy.  Her A/ROM in her shoulder has improved significantly, however lacks approximately 10-15 degrees from WNL.  Her strength has improved to 5/5 in her given range.  She is able to complete all daily activities without difficulty, except for reaching behind her back to pull her pants up or fasten her bra, which is still  quite painful and difficult due to limited mobility.  Patient will attempt HEP for 2 weeks and then follow up with additional appointments as needed.     Rehab Potential  Good    OT Frequency  --   once every 2 weeks    OT Duration  4 weeks    OT Treatment/Interventions  Self-care/ADL  training;Electrical Stimulation;Therapeutic exercise;Patient/family education;Neuromuscular education;Moist Heat;Energy conservation;Therapeutic activities;Passive range of motion;Manual Therapy;Cryotherapy;Ultrasound;DME and/or AE instruction    Plan  P: Follow up on HEP adherence and assess A/ROM for improved range into external and internal rotation  in order to reach behind head and back during ADL compeltion without pain.      Consulted and Agree with Plan of Care  Patient       Patient will benefit from skilled therapeutic intervention in order to improve the following deficits and impairments:  Pain, Increased fascial restrictions, Increased muscle spasms, Decreased strength, Decreased range of motion, Impaired perceived functional ability, Impaired UE functional use  Visit Diagnosis: Acute pain of left shoulder - Plan: Ot plan of care cert/re-cert  Stiffness of left shoulder, not elsewhere classified - Plan: Ot plan of care cert/re-cert  Other symptoms and signs involving the musculoskeletal system - Plan: Ot plan of care cert/re-cert    Problem List Patient Active Problem List   Diagnosis Date Noted  . Urinary frequency 02/28/2015  . UTI (lower urinary tract infection) 02/28/2015  . Lumbago 03/30/2013  . Gluteal pain 03/30/2013  . Hip pain, right 06/09/2012  . Cystitis, acute 11/09/2011  . Hip pain 10/23/2011  . SI (sacroiliac) joint inflammation ([place]) 10/23/2011  . Depressive disorder, not elsewhere classified 10/08/2010  . Prediabetes 08/16/2009  . FATIGUE 03/10/2009  . VARICOSE VEINS LOWER EXTREMITIES W/OTH COMPS 12/09/2008  . HYPERLIPIDEMIA 11/27/2007  . OBESITY 11/27/2007  . HYPERTENSION 11/27/2007  . DERMATITIS 11/27/2007    Vangie Bicker, [place], OTR/L [phone]  01/28/2018, 12:27 PM OCCUPATIONAL THERAPY DISCHARGE SUMMARY 03/05/18  Visits from Start of Care: 8 Current functional level related to goals / functional outcomes: Please see above.    Remaining deficits: Please see above.   Education / Equipment: Please see above.  Plan: Patient agrees to discharge.  Patient goals were partially met. Patient is being discharged due to not returning since the last visit.  ?????         Vangie Bicker, [place], OTR/L [place] [address] [place], [place], [address] Phone: [phone]   Fax:  [phone]  Name: [name] MRN: [id number] Date of Birth: Jul 17, 1950

## 2018-01-28 NOTE — Patient Instructions (Signed)
ROM: Towel Stretch - with Interior Rotation    Pull left arm up behind back by pulling towel up with other arm. Hold ____ seconds. Repeat ____ times per set. Do ____ sets per session. Do ____ sessions per day.  http://orth.exer.us/889   Copyright  VHI. All rights reserved.  Coordination: Around Back Hand-Off    Sit on ball, passing ___ lb ball from hand to hand around back. Repeat in other direction for set. Do ___ sets of ___ repetitions.  Copyright  VHI. All rights reserved.  External Rotation: Side-Lying (Dumbbell)    Lie with neck supported, left elbow bent to 90, forearm across stomach. Raise forearm, keeping elbow at side. Repeat ____ times per set. Do ____ sets per session. Do ____ sessions per week. Use ____ lb weight.   Copyright  VHI. All rights reserved.  External Rotation (Active)    Bring hand behind neck, with elbow out to side. Can be done while lying on back. Repeat ____ times. Do ____ sessions per day. Activity: Use this motion to wash your upper back when bathing.  Copyright  VHI. All rights reserved.  Internal Rotation    Stand with hands clasped behind back. Pull elbows back as far as possible. Hold ____ seconds. Repeat ____ times. Do ____ sessions per day.  Copyright  VHI. All rights reserved.

## 2018-01-29 ENCOUNTER — Encounter (HOSPITAL_COMMUNITY): Payer: Self-pay | Admitting: Occupational Therapy

## 2018-01-29 DIAGNOSIS — M7502 Adhesive capsulitis of left shoulder: Secondary | ICD-10-CM | POA: Diagnosis not present

## 2018-01-29 DIAGNOSIS — M25512 Pain in left shoulder: Secondary | ICD-10-CM | POA: Diagnosis not present

## 2018-02-11 ENCOUNTER — Ambulatory Visit (HOSPITAL_COMMUNITY): Payer: Medicare PPO | Attending: Orthopedic Surgery | Admitting: Occupational Therapy

## 2018-04-11 DIAGNOSIS — Z0000 Encounter for general adult medical examination without abnormal findings: Secondary | ICD-10-CM | POA: Diagnosis not present

## 2018-04-11 DIAGNOSIS — F331 Major depressive disorder, recurrent, moderate: Secondary | ICD-10-CM | POA: Diagnosis not present

## 2018-04-11 DIAGNOSIS — Z23 Encounter for immunization: Secondary | ICD-10-CM | POA: Diagnosis not present

## 2018-04-11 DIAGNOSIS — M5430 Sciatica, unspecified side: Secondary | ICD-10-CM | POA: Diagnosis not present

## 2018-04-11 DIAGNOSIS — M545 Low back pain: Secondary | ICD-10-CM | POA: Diagnosis not present

## 2018-04-11 DIAGNOSIS — E1165 Type 2 diabetes mellitus with hyperglycemia: Secondary | ICD-10-CM | POA: Diagnosis not present

## 2018-04-11 DIAGNOSIS — E782 Mixed hyperlipidemia: Secondary | ICD-10-CM | POA: Diagnosis not present

## 2018-04-11 DIAGNOSIS — I129 Hypertensive chronic kidney disease with stage 1 through stage 4 chronic kidney disease, or unspecified chronic kidney disease: Secondary | ICD-10-CM | POA: Diagnosis not present

## 2018-04-11 DIAGNOSIS — N182 Chronic kidney disease, stage 2 (mild): Secondary | ICD-10-CM | POA: Diagnosis not present

## 2018-04-14 DIAGNOSIS — F419 Anxiety disorder, unspecified: Secondary | ICD-10-CM | POA: Diagnosis not present

## 2018-04-14 DIAGNOSIS — M545 Low back pain: Secondary | ICD-10-CM | POA: Diagnosis not present

## 2018-04-14 DIAGNOSIS — E782 Mixed hyperlipidemia: Secondary | ICD-10-CM | POA: Diagnosis not present

## 2018-04-14 DIAGNOSIS — Z23 Encounter for immunization: Secondary | ICD-10-CM | POA: Diagnosis not present

## 2018-04-14 DIAGNOSIS — F331 Major depressive disorder, recurrent, moderate: Secondary | ICD-10-CM | POA: Diagnosis not present

## 2018-04-14 DIAGNOSIS — Z0000 Encounter for general adult medical examination without abnormal findings: Secondary | ICD-10-CM | POA: Diagnosis not present

## 2018-04-14 DIAGNOSIS — I129 Hypertensive chronic kidney disease with stage 1 through stage 4 chronic kidney disease, or unspecified chronic kidney disease: Secondary | ICD-10-CM | POA: Diagnosis not present

## 2018-04-14 DIAGNOSIS — E1165 Type 2 diabetes mellitus with hyperglycemia: Secondary | ICD-10-CM | POA: Diagnosis not present

## 2018-04-14 DIAGNOSIS — N182 Chronic kidney disease, stage 2 (mild): Secondary | ICD-10-CM | POA: Diagnosis not present

## 2018-07-08 DIAGNOSIS — L309 Dermatitis, unspecified: Secondary | ICD-10-CM | POA: Diagnosis not present

## 2018-07-08 DIAGNOSIS — L280 Lichen simplex chronicus: Secondary | ICD-10-CM | POA: Diagnosis not present

## 2018-07-08 DIAGNOSIS — L0100 Impetigo, unspecified: Secondary | ICD-10-CM | POA: Diagnosis not present

## 2018-07-23 DIAGNOSIS — L280 Lichen simplex chronicus: Secondary | ICD-10-CM | POA: Diagnosis not present

## 2018-08-20 DIAGNOSIS — L280 Lichen simplex chronicus: Secondary | ICD-10-CM | POA: Diagnosis not present

## 2018-09-01 DIAGNOSIS — E782 Mixed hyperlipidemia: Secondary | ICD-10-CM | POA: Diagnosis not present

## 2018-09-01 DIAGNOSIS — L2081 Atopic neurodermatitis: Secondary | ICD-10-CM | POA: Diagnosis not present

## 2018-09-01 DIAGNOSIS — M5431 Sciatica, right side: Secondary | ICD-10-CM | POA: Diagnosis not present

## 2018-09-01 DIAGNOSIS — F331 Major depressive disorder, recurrent, moderate: Secondary | ICD-10-CM | POA: Diagnosis not present

## 2018-09-01 DIAGNOSIS — F419 Anxiety disorder, unspecified: Secondary | ICD-10-CM | POA: Diagnosis not present

## 2018-09-01 DIAGNOSIS — I10 Essential (primary) hypertension: Secondary | ICD-10-CM | POA: Diagnosis not present

## 2018-09-01 DIAGNOSIS — M545 Low back pain: Secondary | ICD-10-CM | POA: Diagnosis not present

## 2018-09-01 DIAGNOSIS — N182 Chronic kidney disease, stage 2 (mild): Secondary | ICD-10-CM | POA: Diagnosis not present

## 2018-09-01 DIAGNOSIS — E1165 Type 2 diabetes mellitus with hyperglycemia: Secondary | ICD-10-CM | POA: Diagnosis not present

## 2018-09-23 DIAGNOSIS — L280 Lichen simplex chronicus: Secondary | ICD-10-CM | POA: Diagnosis not present

## 2018-10-02 DIAGNOSIS — Z0000 Encounter for general adult medical examination without abnormal findings: Secondary | ICD-10-CM | POA: Diagnosis not present

## 2018-10-08 ENCOUNTER — Encounter: Payer: Self-pay | Admitting: *Deleted

## 2018-10-17 DIAGNOSIS — E782 Mixed hyperlipidemia: Secondary | ICD-10-CM | POA: Diagnosis not present

## 2018-10-17 DIAGNOSIS — I10 Essential (primary) hypertension: Secondary | ICD-10-CM | POA: Diagnosis not present

## 2018-10-17 DIAGNOSIS — N182 Chronic kidney disease, stage 2 (mild): Secondary | ICD-10-CM | POA: Diagnosis not present

## 2018-10-17 DIAGNOSIS — E1165 Type 2 diabetes mellitus with hyperglycemia: Secondary | ICD-10-CM | POA: Diagnosis not present

## 2018-10-17 DIAGNOSIS — I129 Hypertensive chronic kidney disease with stage 1 through stage 4 chronic kidney disease, or unspecified chronic kidney disease: Secondary | ICD-10-CM | POA: Diagnosis not present

## 2018-10-22 DIAGNOSIS — E782 Mixed hyperlipidemia: Secondary | ICD-10-CM | POA: Diagnosis not present

## 2018-10-22 DIAGNOSIS — L2081 Atopic neurodermatitis: Secondary | ICD-10-CM | POA: Diagnosis not present

## 2018-10-22 DIAGNOSIS — N182 Chronic kidney disease, stage 2 (mild): Secondary | ICD-10-CM | POA: Diagnosis not present

## 2018-10-22 DIAGNOSIS — Z23 Encounter for immunization: Secondary | ICD-10-CM | POA: Diagnosis not present

## 2018-10-22 DIAGNOSIS — F411 Generalized anxiety disorder: Secondary | ICD-10-CM | POA: Diagnosis not present

## 2018-10-22 DIAGNOSIS — E1165 Type 2 diabetes mellitus with hyperglycemia: Secondary | ICD-10-CM | POA: Diagnosis not present

## 2018-10-22 DIAGNOSIS — Z0000 Encounter for general adult medical examination without abnormal findings: Secondary | ICD-10-CM | POA: Diagnosis not present

## 2018-10-22 DIAGNOSIS — M5430 Sciatica, unspecified side: Secondary | ICD-10-CM | POA: Diagnosis not present

## 2018-10-22 DIAGNOSIS — I129 Hypertensive chronic kidney disease with stage 1 through stage 4 chronic kidney disease, or unspecified chronic kidney disease: Secondary | ICD-10-CM | POA: Diagnosis not present

## 2018-10-22 DIAGNOSIS — E1122 Type 2 diabetes mellitus with diabetic chronic kidney disease: Secondary | ICD-10-CM | POA: Diagnosis not present

## 2018-10-22 DIAGNOSIS — M545 Low back pain: Secondary | ICD-10-CM | POA: Diagnosis not present

## 2018-10-22 DIAGNOSIS — E785 Hyperlipidemia, unspecified: Secondary | ICD-10-CM | POA: Diagnosis not present

## 2018-10-22 DIAGNOSIS — L259 Unspecified contact dermatitis, unspecified cause: Secondary | ICD-10-CM | POA: Diagnosis not present

## 2018-10-22 DIAGNOSIS — F331 Major depressive disorder, recurrent, moderate: Secondary | ICD-10-CM | POA: Diagnosis not present

## 2018-11-04 DIAGNOSIS — L280 Lichen simplex chronicus: Secondary | ICD-10-CM | POA: Diagnosis not present

## 2018-11-07 DIAGNOSIS — Z7984 Long term (current) use of oral hypoglycemic drugs: Secondary | ICD-10-CM | POA: Diagnosis not present

## 2018-11-07 DIAGNOSIS — E119 Type 2 diabetes mellitus without complications: Secondary | ICD-10-CM | POA: Diagnosis not present

## 2018-11-07 DIAGNOSIS — E669 Obesity, unspecified: Secondary | ICD-10-CM | POA: Diagnosis not present

## 2018-11-07 DIAGNOSIS — Z6830 Body mass index (BMI) 30.0-30.9, adult: Secondary | ICD-10-CM | POA: Diagnosis not present

## 2018-11-07 DIAGNOSIS — I10 Essential (primary) hypertension: Secondary | ICD-10-CM | POA: Diagnosis not present

## 2018-11-24 ENCOUNTER — Ambulatory Visit: Payer: Medicare PPO

## 2019-02-06 ENCOUNTER — Other Ambulatory Visit: Payer: Self-pay

## 2019-02-06 DIAGNOSIS — M79604 Pain in right leg: Secondary | ICD-10-CM

## 2019-02-11 ENCOUNTER — Encounter (HOSPITAL_COMMUNITY): Payer: Medicare PPO

## 2019-02-11 ENCOUNTER — Encounter: Payer: Medicare PPO | Admitting: Vascular Surgery

## 2019-02-24 ENCOUNTER — Ambulatory Visit: Payer: Medicare PPO

## 2019-03-17 DIAGNOSIS — G500 Trigeminal neuralgia: Secondary | ICD-10-CM | POA: Diagnosis not present

## 2019-04-20 DIAGNOSIS — E1165 Type 2 diabetes mellitus with hyperglycemia: Secondary | ICD-10-CM | POA: Diagnosis not present

## 2019-04-20 DIAGNOSIS — E785 Hyperlipidemia, unspecified: Secondary | ICD-10-CM | POA: Diagnosis not present

## 2019-04-20 DIAGNOSIS — I10 Essential (primary) hypertension: Secondary | ICD-10-CM | POA: Diagnosis not present

## 2019-04-20 DIAGNOSIS — E1122 Type 2 diabetes mellitus with diabetic chronic kidney disease: Secondary | ICD-10-CM | POA: Diagnosis not present

## 2019-04-20 DIAGNOSIS — N182 Chronic kidney disease, stage 2 (mild): Secondary | ICD-10-CM | POA: Diagnosis not present

## 2019-04-20 DIAGNOSIS — E782 Mixed hyperlipidemia: Secondary | ICD-10-CM | POA: Diagnosis not present

## 2019-04-22 DIAGNOSIS — E669 Obesity, unspecified: Secondary | ICD-10-CM | POA: Diagnosis not present

## 2019-04-22 DIAGNOSIS — L259 Unspecified contact dermatitis, unspecified cause: Secondary | ICD-10-CM | POA: Diagnosis not present

## 2019-04-22 DIAGNOSIS — F411 Generalized anxiety disorder: Secondary | ICD-10-CM | POA: Diagnosis not present

## 2019-04-22 DIAGNOSIS — F331 Major depressive disorder, recurrent, moderate: Secondary | ICD-10-CM | POA: Diagnosis not present

## 2019-04-22 DIAGNOSIS — E1169 Type 2 diabetes mellitus with other specified complication: Secondary | ICD-10-CM | POA: Diagnosis not present

## 2019-04-22 DIAGNOSIS — E782 Mixed hyperlipidemia: Secondary | ICD-10-CM | POA: Diagnosis not present

## 2019-04-22 DIAGNOSIS — I10 Essential (primary) hypertension: Secondary | ICD-10-CM | POA: Diagnosis not present

## 2019-04-22 DIAGNOSIS — M5430 Sciatica, unspecified side: Secondary | ICD-10-CM | POA: Diagnosis not present

## 2019-04-22 DIAGNOSIS — M545 Low back pain: Secondary | ICD-10-CM | POA: Diagnosis not present

## 2019-07-28 IMAGING — MG 2D DIGITAL SCREENING BILATERAL MAMMOGRAM WITH 3D TOMO WITH CAD
8 series · 8 of 24 positions shown · non-contrast
Comparison: Previous exam(s).

CLINICAL DATA: Screening.

EXAM:
2D DIGITAL SCREENING BILATERAL MAMMOGRAM WITH 3D TOMO WITH CAD

[L CC]
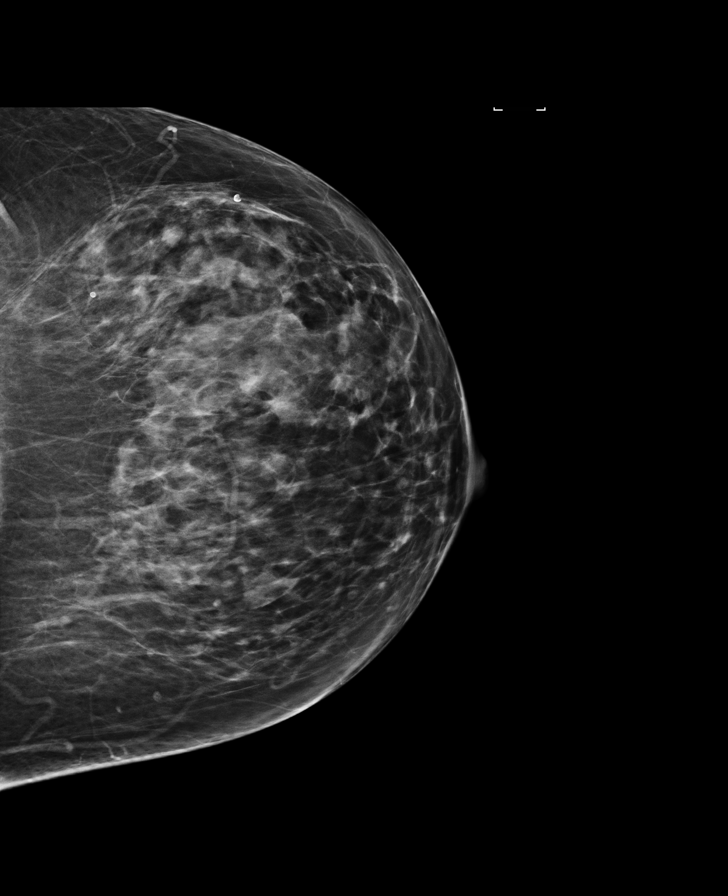

[L MLO]
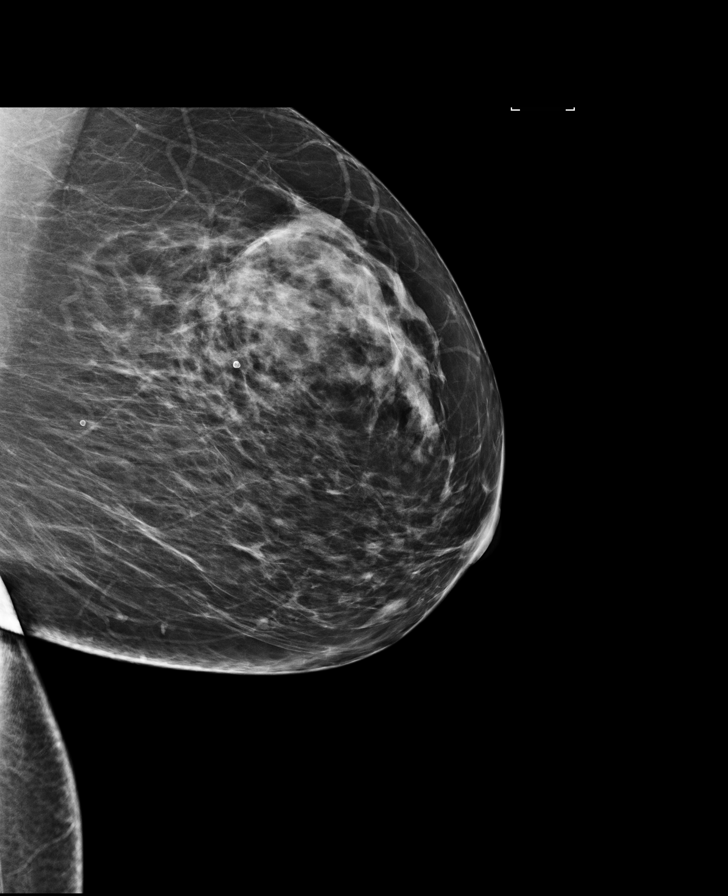

[R CC]
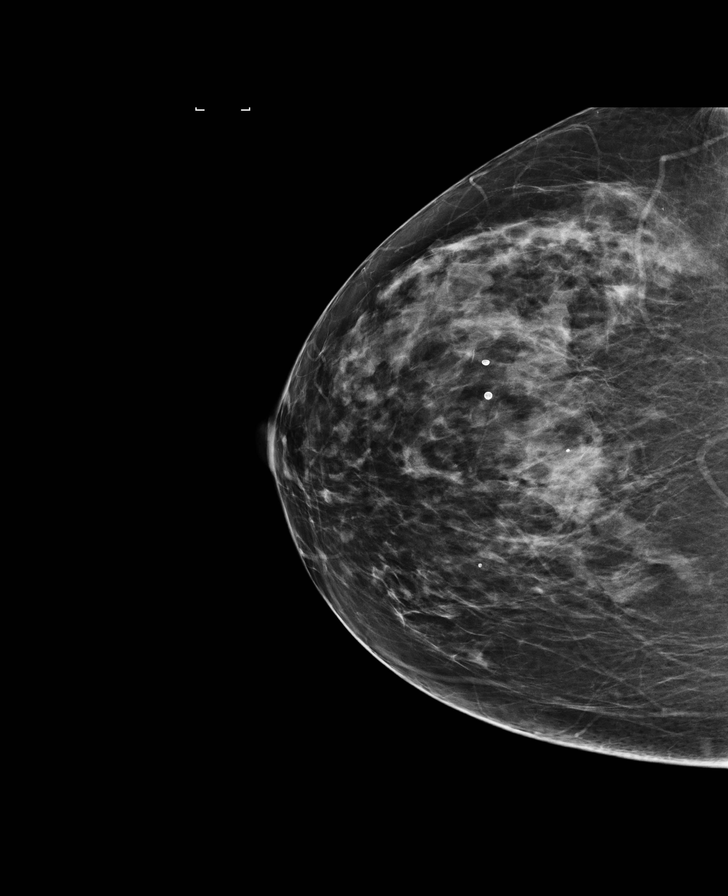

[R MLO]
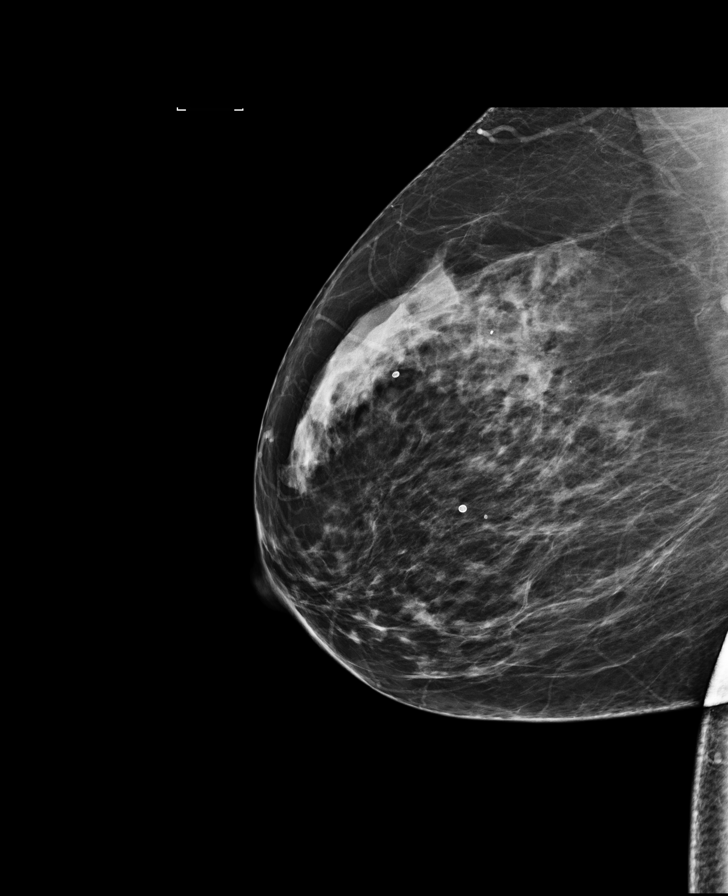

[L MLO tomo · tomo slice 39/77.0]
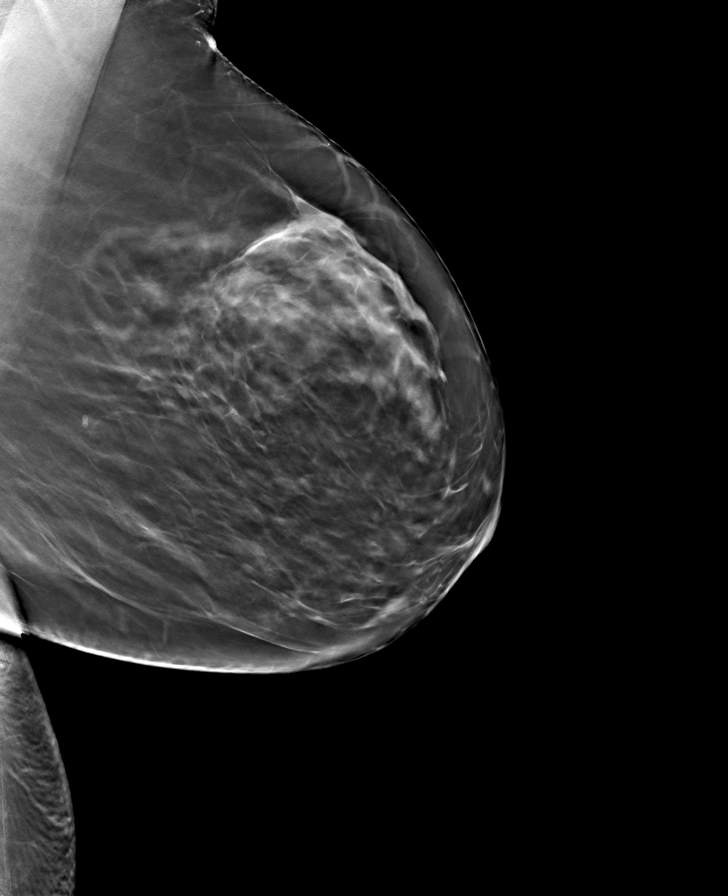

[L CC tomo · tomo slice 35/70.0]
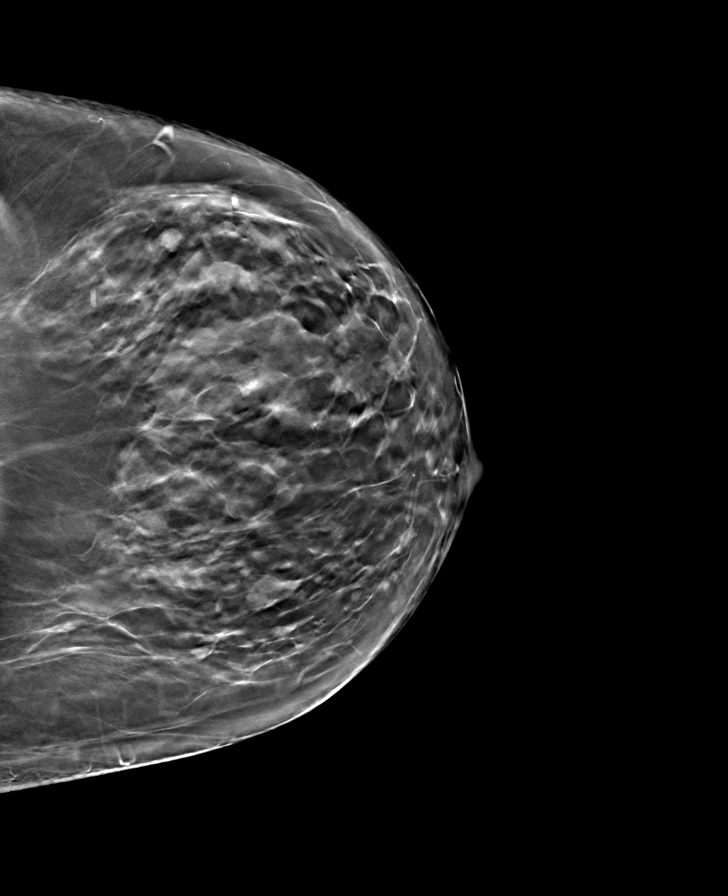

[R CC tomo · tomo slice 35/69.0]
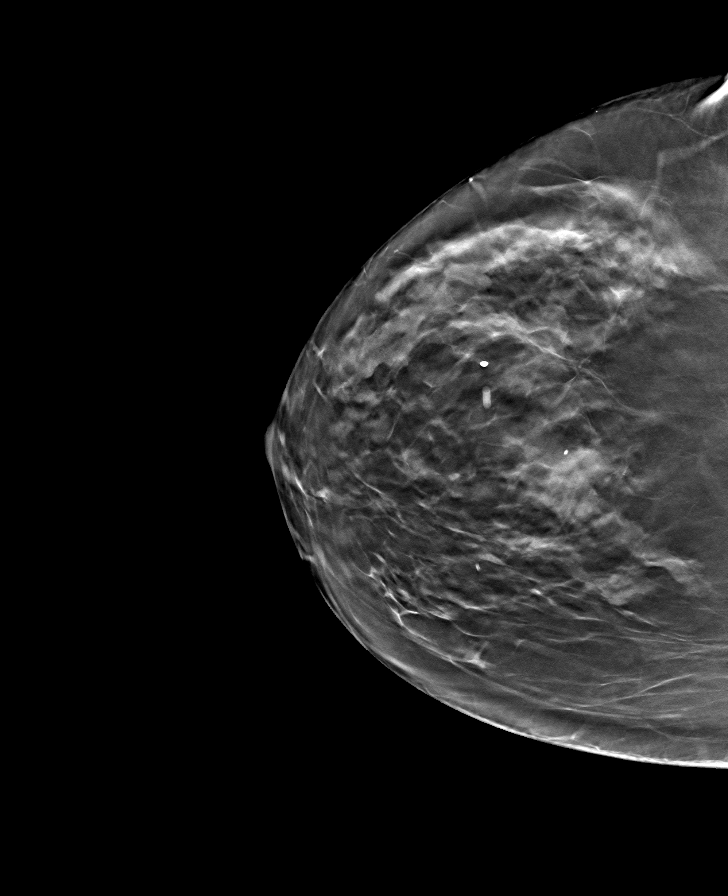

[R MLO tomo · tomo slice 38/75.0]
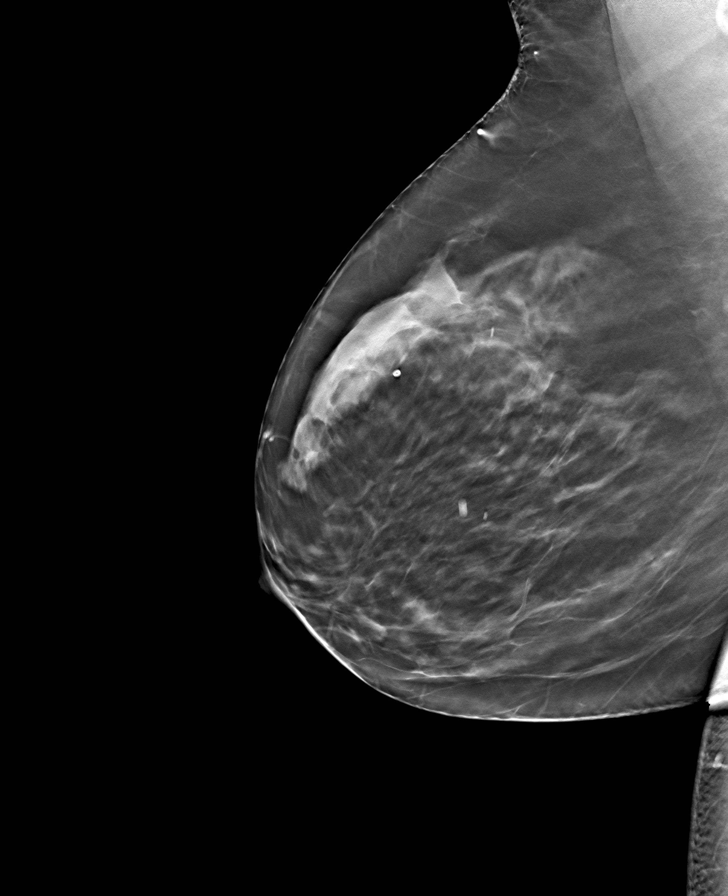

[8 of 24 positions shown; findings below may reference images not displayed]

ACR Breast Density Category b: There are scattered areas of
fibroglandular density.
FINDINGS: There are no findings suspicious for malignancy. Images were
processed with CAD.
IMPRESSION: No mammographic evidence of malignancy. A result letter of this
screening mammogram will be mailed directly to the patient.

RECOMMENDATION:
Screening mammogram in one year. (Code:[id number])

BI-RADS CATEGORY  1: Negative.

## 2019-09-17 DIAGNOSIS — H8110 Benign paroxysmal vertigo, unspecified ear: Secondary | ICD-10-CM | POA: Diagnosis not present

## 2019-09-17 DIAGNOSIS — M545 Low back pain: Secondary | ICD-10-CM | POA: Diagnosis not present

## 2019-09-17 DIAGNOSIS — R42 Dizziness and giddiness: Secondary | ICD-10-CM | POA: Diagnosis not present

## 2019-11-08 ENCOUNTER — Encounter: Payer: Self-pay | Admitting: Emergency Medicine

## 2019-11-08 ENCOUNTER — Other Ambulatory Visit: Payer: Self-pay

## 2019-11-08 ENCOUNTER — Ambulatory Visit
Admission: EM | Admit: 2019-11-08 | Discharge: 2019-11-08 | Disposition: A | Discharge status: Home / Self Care | Payer: Medicare PPO | Attending: Emergency Medicine | Admitting: Emergency Medicine

## 2019-11-08 DIAGNOSIS — J208 Acute bronchitis due to other specified organisms: Secondary | ICD-10-CM | POA: Diagnosis not present

## 2019-11-08 DIAGNOSIS — Z1152 Encounter for screening for COVID-19: Secondary | ICD-10-CM | POA: Diagnosis not present

## 2019-11-08 MED ORDER — BENZONATATE 100 MG PO CAPS
100.0000 mg | ORAL_CAPSULE | Freq: Three times a day (TID) | ORAL | 0 refills | Status: DC
Start: 2019-11-08 — End: 2023-12-04

## 2019-11-08 MED ORDER — FLUTICASONE PROPIONATE 50 MCG/ACT NA SUSP
1.0000 | Freq: Every day | NASAL | 0 refills | Status: DC
Start: 2019-11-08 — End: 2023-12-04

## 2019-11-08 MED ORDER — PREDNISONE 10 MG PO TABS: 20.0000 mg | ORAL_TABLET | Freq: Every day | ORAL | 0 refills | Status: AC

## 2019-11-08 MED ORDER — AZITHROMYCIN 250 MG PO TABS: 250.0000 mg | ORAL_TABLET | Freq: Every day | ORAL | 0 refills | Status: DC

## 2019-11-08 NOTE — ED Provider Notes (Signed)
[facility]   [id number] 11/08/19 Arrival Time: 0830   CC: URI SUBJECTIVE: History from: patient.  [name] is a 69 y.o. female who to the urgent care for complaint of nasal congestion cough with yellowish-green discharge and sore throat for the past 4 days.  Has received both Covid vaccine.  Denies sick exposure to COVID, flu or strep.  Denies recent travel.  Has tried OTC Zyrtec without relief.  Symptoms are made worse at night.  Denies previous symptoms in the past.   Denies fever, chills, fatigue, sinus pain, rhinorrhea, , SOB, wheezing, chest pain, nausea, changes in bowel or bladder habits.      Received flu shot this year: yes.  ROS: As per HPI.  All other pertinent ROS negative.     Past Medical History:  Diagnosis Date  . Dermatitis   . Diabetes mellitus   . Hyperlipidemia   . Hypertension   . IGT (impaired glucose tolerance)   . Obesity   . Spinal stenosis   . Urinary frequency 02/28/2015  . UTI (lower urinary tract infection) 02/28/2015   Past Surgical History:  Procedure Laterality Date  . right breast biopsy calcification  benign  2003  . spinal ablation  11/02/14  . TUBAL LIGATION     Allergies  Allergen Reactions  . Codeine Nausea And Vomiting  . Sulfonamide Derivatives Nausea And Vomiting   No current facility-administered medications on file prior to encounter.   Current Outpatient Medications on File Prior to Encounter  Medication Sig Dispense Refill  . atenolol (TENORMIN) 25 MG tablet Take 25 mg by mouth daily.    Marland Kitchen glipiZIDE (GLUCOTROL XL) 5 MG 24 hr tablet Take 5 mg by mouth daily with breakfast.    . lisinopril-hydrochlorothiazide (PRINZIDE,ZESTORETIC) 10-12.5 MG per tablet take 1 tablet by mouth once daily 30 tablet 0  . meloxicam (MOBIC) 15 MG tablet Take 15 mg by mouth daily.    . metFORMIN (GLUCOPHAGE) 500 MG tablet Take by mouth 2 (two) times daily with a meal.    . ciprofloxacin (CIPRO) 500 MG tablet Take 1 tablet (500 mg  total) by mouth 2 (two) times daily. (Patient not taking: Reported on 12/30/2017) 10 tablet 0  . citalopram (CELEXA) 20 MG tablet Take 20 mg by mouth daily.    . clotrimazole (LOTRIMIN) 1 % cream Apply to affected area 2 times daily (Patient not taking: Reported on 12/30/2017) 40 g 0   Social History   Socioeconomic History  . Marital status: Widowed    Spouse name: Not on file  . Number of children: 1  . Years of education: Not on file  . Highest education level: Not on file  Occupational History  . Occupation: employed   Tobacco Use  . Smoking status: Never Smoker  . Smokeless tobacco: Never Used  Substance and Sexual Activity  . Alcohol use: No  . Drug use: No  . Sexual activity: Yes    Birth control/protection: Surgical    Comment: tubal  Other Topics Concern  . Not on file  Social History Narrative  . Not on file   Social Determinants of Health   Financial Resource Strain:   . Difficulty of Paying Living Expenses:   Food Insecurity:   . Worried About Programme researcher, broadcasting/film/video in the Last Year:   . Barista in the Last Year:   Transportation Needs:   . Freight forwarder (Medical):   Marland Kitchen Lack of Transportation (Non-Medical):  Physical Activity:   . Days of Exercise per Week:   . Minutes of Exercise per Session:   Stress:   . Feeling of Stress :   Social Connections:   . Frequency of Communication with Friends and Family:   . Frequency of Social Gatherings with Friends and Family:   . Attends Religious Services:   . Active Member of Clubs or Organizations:   . Attends Banker Meetings:   Marland Kitchen Marital Status:   Intimate Partner Violence:   . Fear of Current or Ex-Partner:   . Emotionally Abused:   Marland Kitchen Physically Abused:   . Sexually Abused:    Family History  Problem Relation Age of Onset  . Addison's disease Mother   . Diabetes Mother   . Heart disease Mother   . Stroke Father   . Heart attack Father   . Heart disease Father   . Irregular  heart beat Sister   . Hypertension Sister   . Heart disease Sister   . Diabetes Sister   . Hypertension Sister   . Coronary artery disease Brother   . Heart disease Brother   . Diabetes Brother   . Hypertension Brother     OBJECTIVE:  Vitals:   11/08/19 0850  BP: (!) 167/81  Pulse: 93  Resp: 18  Temp: 98.3 F (36.8 C)  TempSrc: Oral  SpO2: 96%     General appearance: alert; appears fatigued, but nontoxic; speaking in full sentences and tolerating own secretions HEENT: NCAT; Ears: EACs clear, TMs pearly gray; Eyes: PERRL.  EOM grossly intact. Sinuses: nontender; Nose: nares patent without rhinorrhea, Throat: oropharynx clear, tonsils non erythematous or enlarged, uvula midline  Neck: supple without LAD Lungs: unlabored respirations, symmetrical air entry; cough: moderate; no respiratory distress; CTAB Heart: regular rate and rhythm.  Radial pulses 2+ symmetrical bilaterally Skin: warm and dry Psychological: alert and cooperative; normal mood and affect  LABS:  No results found for this or any previous visit (from the past 24 hour(s)).   ASSESSMENT & PLAN:  1. Acute bronchitis due to other specified organisms   2. Encounter for screening for COVID-19     Meds ordered this encounter  Medications  . azithromycin (ZITHROMAX) 250 MG tablet    Sig: Take 1 tablet (250 mg total) by mouth daily. Take first 2 tablets together, then 1 every day until finished.    Dispense:  6 tablet    Refill:  0  . fluticasone (FLONASE) 50 MCG/ACT nasal spray    Sig: Place 1 spray into both nostrils daily for 14 days.    Dispense:  16 g    Refill:  0  . predniSONE (DELTASONE) 10 MG tablet    Sig: Take 2 tablets (20 mg total) by mouth daily for 5 days.    Dispense:  10 tablet    Refill:  0  . benzonatate (TESSALON) 100 MG capsule    Sig: Take 1 capsule (100 mg total) by mouth every 8 (eight) hours.    Dispense:  30 capsule    Refill:  0    Discharge instructions COVID testing  ordered.  It will take between 2-7 days for test results.  Someone will contact you regarding abnormal results.    In the meantime: You should remain isolated in your home for 10 days from symptom onset AND greater than 24 hours after symptoms resolution (absence of fever without the use of fever-reducing medication and improvement in respiratory symptoms), whichever is longer Get  plenty of rest and push fluids Prescribed azithromycin, Flonase, prednisone and Tessalon Use medications daily for symptom relief Use OTC medications like ibuprofen or tylenol as needed fever or pain Call or go to the ED if you have any new or worsening symptoms such as fever, worsening cough, shortness of breath, chest tightness, chest pain, turning blue, changes in mental status, etc...   Reviewed expectations re: course of current medical issues. Questions answered. Outlined signs and symptoms indicating need for more acute intervention. Patient verbalized understanding. After Visit Summary given.         Emerson Monte, [place] 11/08/19 [phone]

## 2019-11-08 NOTE — ED Triage Notes (Signed)
Started about 4 days ago.  Has had nasal congestion, cough, and sore throat. Has had both covid vaccines.

## 2019-11-08 NOTE — Discharge Instructions (Addendum)
COVID testing ordered.  It will take between 2-7 days for test results.  Someone will contact you regarding abnormal results.    In the meantime: You should remain isolated in your home for 10 days from symptom onset AND greater than 24 hours after symptoms resolution (absence of fever without the use of fever-reducing medication and improvement in respiratory symptoms), whichever is longer Get plenty of rest and push fluids Prescribed azithromycin, Flonase, prednisone and Tessalon Use medications daily for symptom relief Use OTC medications like ibuprofen or tylenol as needed fever or pain Call or go to the ED if you have any new or worsening symptoms such as fever, worsening cough, shortness of breath, chest tightness, chest pain, turning blue, changes in mental status, etc

## 2019-11-09 LAB — SARS-COV-2, NAA 2 DAY TAT

## 2019-11-09 LAB — NOVEL CORONAVIRUS, NAA: SARS-CoV-2, NAA: NOT DETECTED

## 2019-11-25 DIAGNOSIS — E1169 Type 2 diabetes mellitus with other specified complication: Secondary | ICD-10-CM | POA: Diagnosis not present

## 2019-11-25 DIAGNOSIS — Z0000 Encounter for general adult medical examination without abnormal findings: Secondary | ICD-10-CM | POA: Diagnosis not present

## 2019-11-25 DIAGNOSIS — E669 Obesity, unspecified: Secondary | ICD-10-CM | POA: Diagnosis not present

## 2019-11-25 DIAGNOSIS — E785 Hyperlipidemia, unspecified: Secondary | ICD-10-CM | POA: Diagnosis not present

## 2019-11-25 DIAGNOSIS — E1165 Type 2 diabetes mellitus with hyperglycemia: Secondary | ICD-10-CM | POA: Diagnosis not present

## 2019-11-25 DIAGNOSIS — E782 Mixed hyperlipidemia: Secondary | ICD-10-CM | POA: Diagnosis not present

## 2019-11-25 DIAGNOSIS — E1122 Type 2 diabetes mellitus with diabetic chronic kidney disease: Secondary | ICD-10-CM | POA: Diagnosis not present

## 2019-11-25 DIAGNOSIS — F331 Major depressive disorder, recurrent, moderate: Secondary | ICD-10-CM | POA: Diagnosis not present

## 2019-11-25 DIAGNOSIS — E559 Vitamin D deficiency, unspecified: Secondary | ICD-10-CM | POA: Diagnosis not present

## 2019-11-27 DIAGNOSIS — F331 Major depressive disorder, recurrent, moderate: Secondary | ICD-10-CM | POA: Diagnosis not present

## 2019-11-27 DIAGNOSIS — M5430 Sciatica, unspecified side: Secondary | ICD-10-CM | POA: Diagnosis not present

## 2019-11-27 DIAGNOSIS — E669 Obesity, unspecified: Secondary | ICD-10-CM | POA: Diagnosis not present

## 2019-11-27 DIAGNOSIS — E782 Mixed hyperlipidemia: Secondary | ICD-10-CM | POA: Diagnosis not present

## 2019-11-27 DIAGNOSIS — I10 Essential (primary) hypertension: Secondary | ICD-10-CM | POA: Diagnosis not present

## 2019-11-27 DIAGNOSIS — E1169 Type 2 diabetes mellitus with other specified complication: Secondary | ICD-10-CM | POA: Diagnosis not present

## 2019-11-27 DIAGNOSIS — F411 Generalized anxiety disorder: Secondary | ICD-10-CM | POA: Diagnosis not present

## 2019-11-27 DIAGNOSIS — M545 Low back pain: Secondary | ICD-10-CM | POA: Diagnosis not present

## 2019-11-27 DIAGNOSIS — Z6830 Body mass index (BMI) 30.0-30.9, adult: Secondary | ICD-10-CM | POA: Diagnosis not present

## 2020-03-25 DIAGNOSIS — M25511 Pain in right shoulder: Secondary | ICD-10-CM | POA: Diagnosis not present

## 2020-03-25 DIAGNOSIS — M25512 Pain in left shoulder: Secondary | ICD-10-CM | POA: Diagnosis not present

## 2020-05-25 DIAGNOSIS — H8110 Benign paroxysmal vertigo, unspecified ear: Secondary | ICD-10-CM | POA: Diagnosis not present

## 2020-05-25 DIAGNOSIS — R42 Dizziness and giddiness: Secondary | ICD-10-CM | POA: Diagnosis not present

## 2020-05-25 DIAGNOSIS — E782 Mixed hyperlipidemia: Secondary | ICD-10-CM | POA: Diagnosis not present

## 2020-05-25 DIAGNOSIS — N182 Chronic kidney disease, stage 2 (mild): Secondary | ICD-10-CM | POA: Diagnosis not present

## 2020-05-25 DIAGNOSIS — Z23 Encounter for immunization: Secondary | ICD-10-CM | POA: Diagnosis not present

## 2020-05-25 DIAGNOSIS — Z0000 Encounter for general adult medical examination without abnormal findings: Secondary | ICD-10-CM | POA: Diagnosis not present

## 2020-05-25 DIAGNOSIS — F331 Major depressive disorder, recurrent, moderate: Secondary | ICD-10-CM | POA: Diagnosis not present

## 2020-05-25 DIAGNOSIS — E1122 Type 2 diabetes mellitus with diabetic chronic kidney disease: Secondary | ICD-10-CM | POA: Diagnosis not present

## 2020-05-25 DIAGNOSIS — Z6831 Body mass index (BMI) 31.0-31.9, adult: Secondary | ICD-10-CM | POA: Diagnosis not present

## 2020-05-25 DIAGNOSIS — Z712 Person consulting for explanation of examination or test findings: Secondary | ICD-10-CM | POA: Diagnosis not present

## 2020-05-25 DIAGNOSIS — E1169 Type 2 diabetes mellitus with other specified complication: Secondary | ICD-10-CM | POA: Diagnosis not present

## 2020-05-25 DIAGNOSIS — I10 Essential (primary) hypertension: Secondary | ICD-10-CM | POA: Diagnosis not present

## 2020-05-25 DIAGNOSIS — E669 Obesity, unspecified: Secondary | ICD-10-CM | POA: Diagnosis not present

## 2020-05-25 DIAGNOSIS — E559 Vitamin D deficiency, unspecified: Secondary | ICD-10-CM | POA: Diagnosis not present

## 2020-06-01 DIAGNOSIS — G4709 Other insomnia: Secondary | ICD-10-CM | POA: Diagnosis not present

## 2020-06-01 DIAGNOSIS — E782 Mixed hyperlipidemia: Secondary | ICD-10-CM | POA: Diagnosis not present

## 2020-06-01 DIAGNOSIS — F331 Major depressive disorder, recurrent, moderate: Secondary | ICD-10-CM | POA: Diagnosis not present

## 2020-06-01 DIAGNOSIS — F411 Generalized anxiety disorder: Secondary | ICD-10-CM | POA: Diagnosis not present

## 2020-06-01 DIAGNOSIS — Z0000 Encounter for general adult medical examination without abnormal findings: Secondary | ICD-10-CM | POA: Diagnosis not present

## 2020-06-01 DIAGNOSIS — E669 Obesity, unspecified: Secondary | ICD-10-CM | POA: Diagnosis not present

## 2020-06-01 DIAGNOSIS — E1169 Type 2 diabetes mellitus with other specified complication: Secondary | ICD-10-CM | POA: Diagnosis not present

## 2020-06-01 DIAGNOSIS — M5430 Sciatica, unspecified side: Secondary | ICD-10-CM | POA: Diagnosis not present

## 2020-06-01 DIAGNOSIS — Z6830 Body mass index (BMI) 30.0-30.9, adult: Secondary | ICD-10-CM | POA: Diagnosis not present

## 2020-06-01 DIAGNOSIS — I10 Essential (primary) hypertension: Secondary | ICD-10-CM | POA: Diagnosis not present

## 2020-07-18 DIAGNOSIS — M7541 Impingement syndrome of right shoulder: Secondary | ICD-10-CM | POA: Diagnosis not present

## 2020-11-17 DIAGNOSIS — E1122 Type 2 diabetes mellitus with diabetic chronic kidney disease: Secondary | ICD-10-CM | POA: Diagnosis not present

## 2020-11-17 DIAGNOSIS — Z0001 Encounter for general adult medical examination with abnormal findings: Secondary | ICD-10-CM | POA: Diagnosis not present

## 2020-11-17 DIAGNOSIS — I10 Essential (primary) hypertension: Secondary | ICD-10-CM | POA: Diagnosis not present

## 2020-11-22 DIAGNOSIS — E1165 Type 2 diabetes mellitus with hyperglycemia: Secondary | ICD-10-CM | POA: Diagnosis not present

## 2020-11-22 DIAGNOSIS — F329 Major depressive disorder, single episode, unspecified: Secondary | ICD-10-CM | POA: Diagnosis not present

## 2020-11-22 DIAGNOSIS — I10 Essential (primary) hypertension: Secondary | ICD-10-CM | POA: Diagnosis not present

## 2020-11-22 DIAGNOSIS — E782 Mixed hyperlipidemia: Secondary | ICD-10-CM | POA: Diagnosis not present

## 2021-01-14 ENCOUNTER — Ambulatory Visit
Admission: EM | Admit: 2021-01-14 | Discharge: 2021-01-14 | Disposition: A | Discharge status: Home / Self Care | Payer: Medicare Other | Attending: Emergency Medicine | Admitting: Emergency Medicine

## 2021-01-14 DIAGNOSIS — T63441A Toxic effect of venom of bees, accidental (unintentional), initial encounter: Secondary | ICD-10-CM

## 2021-01-14 DIAGNOSIS — T7840XA Allergy, unspecified, initial encounter: Secondary | ICD-10-CM | POA: Diagnosis not present

## 2021-01-14 DIAGNOSIS — M7989 Other specified soft tissue disorders: Secondary | ICD-10-CM

## 2021-01-14 MED ORDER — DEXAMETHASONE SODIUM PHOSPHATE 10 MG/ML IJ SOLN
10.0000 mg | Freq: Once | INTRAMUSCULAR | Status: AC
  Administered 2021-01-14: 10 mg via INTRAMUSCULAR

## 2021-01-14 MED ORDER — PREDNISONE 20 MG PO TABS
20.0000 mg | ORAL_TABLET | Freq: Two times a day (BID) | ORAL | 0 refills | Status: AC
Start: 2021-01-14 — End: 2021-01-19

## 2021-01-14 NOTE — ED Provider Notes (Signed)
[facility]   [id number] 01/14/21 Arrival Time: 1145  Cc: Allergic reaction  SUBJECTIVE:  [name] is a 70 y.o. female who presents with possible allergic reaction that began last night.  Reports bee sting to LT arm.  Now having swelling and itching.  Has tried OTC medications without relief.  Symptoms are made worse with scratching.  Reports previous symptoms in the past.   Denies fever, chills, nausea, vomiting, swollen glands, oral manifestations such as throat swelling/ tingling, mouth swelling/ tingling, tongue swelling/tingling, dyspnea, SOB, chest pain.  ROS: As per HPI.  All other pertinent ROS negative.     Past Medical History:  Diagnosis Date   Dermatitis    Diabetes mellitus    Hyperlipidemia    Hypertension    IGT (impaired glucose tolerance)    Obesity    Spinal stenosis    Urinary frequency 02/28/2015   UTI (lower urinary tract infection) 02/28/2015   Past Surgical History:  Procedure Laterality Date   right breast biopsy calcification  benign  2003   spinal ablation  11/02/14   TUBAL LIGATION     Allergies  Allergen Reactions   Codeine Nausea And Vomiting   Sulfonamide Derivatives Nausea And Vomiting   No current facility-administered medications on file prior to encounter.   Current Outpatient Medications on File Prior to Encounter  Medication Sig Dispense Refill   atenolol (TENORMIN) 25 MG tablet Take 25 mg by mouth daily.     azithromycin (ZITHROMAX) 250 MG tablet Take 1 tablet (250 mg total) by mouth daily. Take first 2 tablets together, then 1 every day until finished. 6 tablet 0   benzonatate (TESSALON) 100 MG capsule Take 1 capsule (100 mg total) by mouth every 8 (eight) hours. 30 capsule 0   citalopram (CELEXA) 20 MG tablet Take 20 mg by mouth daily.     fluticasone (FLONASE) 50 MCG/ACT nasal spray Place 1 spray into both nostrils daily for 14 days. 16 g 0   glipiZIDE (GLUCOTROL XL) 5 MG 24 hr tablet Take 5 mg by mouth daily with  breakfast.     lisinopril-hydrochlorothiazide (PRINZIDE,ZESTORETIC) 10-12.5 MG per tablet take 1 tablet by mouth once daily 30 tablet 0   meloxicam (MOBIC) 15 MG tablet Take 15 mg by mouth daily.     metFORMIN (GLUCOPHAGE) 500 MG tablet Take by mouth 2 (two) times daily with a meal.      Social History   Socioeconomic History   Marital status: Widowed    Spouse name: Not on file   Number of children: 1   Years of education: Not on file   Highest education level: Not on file  Occupational History   Occupation: employed   Tobacco Use   Smoking status: Never   Smokeless tobacco: Never  Substance and Sexual Activity   Alcohol use: No   Drug use: No   Sexual activity: Yes    Birth control/protection: Surgical    Comment: tubal  Other Topics Concern   Not on file  Social History Narrative   Not on file   Social Determinants of Health   Financial Resource Strain: Not on file  Food Insecurity: Not on file  Transportation Needs: Not on file  Physical Activity: Not on file  Stress: Not on file  Social Connections: Not on file  Intimate Partner Violence: Not on file   Family History  Problem Relation Age of Onset   Addison's disease Mother    Diabetes Mother  Heart disease Mother    Stroke Father    Heart attack Father    Heart disease Father    Irregular heart beat Sister    Hypertension Sister    Heart disease Sister    Diabetes Sister    Hypertension Sister    Coronary artery disease Brother    Heart disease Brother    Diabetes Brother    Hypertension Brother      OBJECTIVE:  Vitals:   01/14/21 1309  BP: (!) 151/82  Pulse: 79  Resp: 16  Temp: 98.7 F (37.1 C)  TempSrc: Oral  SpO2: 94%     General appearance: Alert, speaking in full sentences without difficulty HEENT:NCAT; no obvious facial swelling Neck: supple without LAD Lungs: normal respiratory effort Heart: radial pulse 2+; cap refill < 2 seconds Skin: warm and dry; LT distal arm with  swelling diffuse about the dorsal aspect of hand, 3rd and 4th digits, and wrist, NTTP, no obvious drainage Psychological: alert and cooperative; normal mood and affect  ASSESSMENT & PLAN:  1. Allergic reaction, initial encounter   2. Bee sting, accidental or unintentional, initial encounter   3. Left arm swelling     Meds ordered this encounter  Medications   predniSONE (DELTASONE) 20 MG tablet    Sig: Take 1 tablet (20 mg total) by mouth 2 (two) times daily with a meal for 5 days.    Dispense:  10 tablet    Refill:  0    Order Specific Question:   Supervising Provider    Answer:   Eustace Moore [[id number]]   dexamethasone (DECADRON) injection 10 mg    Steroid shot given in office Prednisone prescribed Continue with benadryl Follow up with PCP as needed Return or go to the ED if you have any new or worsening symptoms such as difficulty breathing, shortness of breath, chest pain, nausea, vomiting, throat tightness or swelling, tongue swelling or tingling, worsening lip or facial swelling, abdominal pain, changes in bowel or bladder habits, no improvement despite medications, etc...   Reviewed expectations re: course of current medical issues. Questions answered. Outlined signs and symptoms indicating need for more acute intervention. Patient verbalized understanding. After Visit Summary given.           Rennis Harding, PA-C 01/14/21 1330

## 2021-01-14 NOTE — ED Triage Notes (Signed)
Pt present bee sting on her left arm. Pt left hand/fingers and arm are swollen and warm to the touch with pain

## 2021-01-14 NOTE — Discharge Instructions (Addendum)
Steroid shot given in office Prednisone prescribed Continue with benadryl Follow up with PCP as needed Return or go to the ED if you have any new or worsening symptoms such as difficulty breathing, shortness of breath, chest pain, nausea, vomiting, throat tightness or swelling, tongue swelling or tingling, worsening lip or facial swelling, abdominal pain, changes in bowel or bladder habits, no improvement despite medications, etc...  

## 2021-05-16 DIAGNOSIS — J069 Acute upper respiratory infection, unspecified: Secondary | ICD-10-CM | POA: Diagnosis not present

## 2021-05-16 DIAGNOSIS — R509 Fever, unspecified: Secondary | ICD-10-CM | POA: Diagnosis not present

## 2021-06-06 DIAGNOSIS — E1165 Type 2 diabetes mellitus with hyperglycemia: Secondary | ICD-10-CM | POA: Diagnosis not present

## 2021-06-06 DIAGNOSIS — I10 Essential (primary) hypertension: Secondary | ICD-10-CM | POA: Diagnosis not present

## 2021-06-13 DIAGNOSIS — E782 Mixed hyperlipidemia: Secondary | ICD-10-CM | POA: Diagnosis not present

## 2021-06-13 DIAGNOSIS — Z0001 Encounter for general adult medical examination with abnormal findings: Secondary | ICD-10-CM | POA: Diagnosis not present

## 2021-06-13 DIAGNOSIS — I10 Essential (primary) hypertension: Secondary | ICD-10-CM | POA: Diagnosis not present

## 2021-06-13 DIAGNOSIS — E1165 Type 2 diabetes mellitus with hyperglycemia: Secondary | ICD-10-CM | POA: Diagnosis not present

## 2021-07-04 DIAGNOSIS — F419 Anxiety disorder, unspecified: Secondary | ICD-10-CM | POA: Diagnosis not present

## 2021-07-04 DIAGNOSIS — E1165 Type 2 diabetes mellitus with hyperglycemia: Secondary | ICD-10-CM | POA: Diagnosis not present

## 2021-07-04 DIAGNOSIS — I10 Essential (primary) hypertension: Secondary | ICD-10-CM | POA: Diagnosis not present

## 2021-07-25 ENCOUNTER — Other Ambulatory Visit (HOSPITAL_COMMUNITY): Payer: Self-pay | Admitting: Family Medicine

## 2021-07-25 DIAGNOSIS — I10 Essential (primary) hypertension: Secondary | ICD-10-CM | POA: Diagnosis not present

## 2021-07-25 DIAGNOSIS — F419 Anxiety disorder, unspecified: Secondary | ICD-10-CM | POA: Diagnosis not present

## 2021-07-25 DIAGNOSIS — Z1231 Encounter for screening mammogram for malignant neoplasm of breast: Secondary | ICD-10-CM

## 2021-07-25 DIAGNOSIS — Z1239 Encounter for other screening for malignant neoplasm of breast: Secondary | ICD-10-CM | POA: Diagnosis not present

## 2021-08-07 ENCOUNTER — Ambulatory Visit (HOSPITAL_COMMUNITY): Payer: Medicare Other

## 2021-08-17 ENCOUNTER — Ambulatory Visit (HOSPITAL_COMMUNITY)
Admission: RE | Admit: 2021-08-17 | Discharge: 2021-08-17 | Disposition: A | Discharge status: Home / Self Care | Payer: Medicare Other | Source: Ambulatory Visit | Attending: Family Medicine | Admitting: Family Medicine

## 2021-08-17 ENCOUNTER — Other Ambulatory Visit: Payer: Self-pay

## 2021-08-17 DIAGNOSIS — Z1231 Encounter for screening mammogram for malignant neoplasm of breast: Secondary | ICD-10-CM | POA: Insufficient documentation

## 2021-09-14 DIAGNOSIS — I10 Essential (primary) hypertension: Secondary | ICD-10-CM | POA: Diagnosis not present

## 2021-09-14 DIAGNOSIS — E1122 Type 2 diabetes mellitus with diabetic chronic kidney disease: Secondary | ICD-10-CM | POA: Diagnosis not present

## 2021-09-14 DIAGNOSIS — E559 Vitamin D deficiency, unspecified: Secondary | ICD-10-CM | POA: Diagnosis not present

## 2021-09-19 DIAGNOSIS — E1165 Type 2 diabetes mellitus with hyperglycemia: Secondary | ICD-10-CM | POA: Diagnosis not present

## 2021-09-19 DIAGNOSIS — F329 Major depressive disorder, single episode, unspecified: Secondary | ICD-10-CM | POA: Diagnosis not present

## 2021-09-19 DIAGNOSIS — E782 Mixed hyperlipidemia: Secondary | ICD-10-CM | POA: Diagnosis not present

## 2021-09-19 DIAGNOSIS — I10 Essential (primary) hypertension: Secondary | ICD-10-CM | POA: Diagnosis not present

## 2021-12-21 DIAGNOSIS — I10 Essential (primary) hypertension: Secondary | ICD-10-CM | POA: Diagnosis not present

## 2021-12-21 DIAGNOSIS — E1165 Type 2 diabetes mellitus with hyperglycemia: Secondary | ICD-10-CM | POA: Diagnosis not present

## 2021-12-28 DIAGNOSIS — I10 Essential (primary) hypertension: Secondary | ICD-10-CM | POA: Diagnosis not present

## 2021-12-28 DIAGNOSIS — E1165 Type 2 diabetes mellitus with hyperglycemia: Secondary | ICD-10-CM | POA: Diagnosis not present

## 2021-12-28 DIAGNOSIS — E782 Mixed hyperlipidemia: Secondary | ICD-10-CM | POA: Diagnosis not present

## 2021-12-28 DIAGNOSIS — F329 Major depressive disorder, single episode, unspecified: Secondary | ICD-10-CM | POA: Diagnosis not present

## 2022-01-18 DIAGNOSIS — H524 Presbyopia: Secondary | ICD-10-CM | POA: Diagnosis not present

## 2022-04-24 DIAGNOSIS — E559 Vitamin D deficiency, unspecified: Secondary | ICD-10-CM | POA: Diagnosis not present

## 2022-04-24 DIAGNOSIS — E1165 Type 2 diabetes mellitus with hyperglycemia: Secondary | ICD-10-CM | POA: Diagnosis not present

## 2022-05-05 DIAGNOSIS — I10 Essential (primary) hypertension: Secondary | ICD-10-CM | POA: Diagnosis not present

## 2022-05-05 DIAGNOSIS — F329 Major depressive disorder, single episode, unspecified: Secondary | ICD-10-CM | POA: Diagnosis not present

## 2022-05-05 DIAGNOSIS — E1165 Type 2 diabetes mellitus with hyperglycemia: Secondary | ICD-10-CM | POA: Diagnosis not present

## 2022-05-05 DIAGNOSIS — E782 Mixed hyperlipidemia: Secondary | ICD-10-CM | POA: Diagnosis not present

## 2022-09-13 DIAGNOSIS — E1165 Type 2 diabetes mellitus with hyperglycemia: Secondary | ICD-10-CM | POA: Diagnosis not present

## 2022-09-13 DIAGNOSIS — E559 Vitamin D deficiency, unspecified: Secondary | ICD-10-CM | POA: Diagnosis not present

## 2022-09-18 DIAGNOSIS — G720 Drug-induced myopathy: Secondary | ICD-10-CM | POA: Diagnosis not present

## 2022-09-18 DIAGNOSIS — E1169 Type 2 diabetes mellitus with other specified complication: Secondary | ICD-10-CM | POA: Diagnosis not present

## 2022-09-18 DIAGNOSIS — Z0001 Encounter for general adult medical examination with abnormal findings: Secondary | ICD-10-CM | POA: Diagnosis not present

## 2022-09-18 DIAGNOSIS — F329 Major depressive disorder, single episode, unspecified: Secondary | ICD-10-CM | POA: Diagnosis not present

## 2022-09-18 DIAGNOSIS — I10 Essential (primary) hypertension: Secondary | ICD-10-CM | POA: Diagnosis not present

## 2022-09-18 DIAGNOSIS — E1159 Type 2 diabetes mellitus with other circulatory complications: Secondary | ICD-10-CM | POA: Diagnosis not present

## 2022-09-18 DIAGNOSIS — E782 Mixed hyperlipidemia: Secondary | ICD-10-CM | POA: Diagnosis not present

## 2023-01-01 DIAGNOSIS — E119 Type 2 diabetes mellitus without complications: Secondary | ICD-10-CM | POA: Diagnosis not present

## 2023-01-17 DIAGNOSIS — E1169 Type 2 diabetes mellitus with other specified complication: Secondary | ICD-10-CM | POA: Diagnosis not present

## 2023-01-17 DIAGNOSIS — E559 Vitamin D deficiency, unspecified: Secondary | ICD-10-CM | POA: Diagnosis not present

## 2023-01-24 ENCOUNTER — Other Ambulatory Visit (HOSPITAL_COMMUNITY): Payer: Self-pay | Admitting: Family Medicine

## 2023-01-24 DIAGNOSIS — F329 Major depressive disorder, single episode, unspecified: Secondary | ICD-10-CM | POA: Diagnosis not present

## 2023-01-24 DIAGNOSIS — I10 Essential (primary) hypertension: Secondary | ICD-10-CM | POA: Diagnosis not present

## 2023-01-24 DIAGNOSIS — E1169 Type 2 diabetes mellitus with other specified complication: Secondary | ICD-10-CM | POA: Diagnosis not present

## 2023-01-24 DIAGNOSIS — E782 Mixed hyperlipidemia: Secondary | ICD-10-CM | POA: Diagnosis not present

## 2023-01-24 DIAGNOSIS — Z1231 Encounter for screening mammogram for malignant neoplasm of breast: Secondary | ICD-10-CM

## 2023-01-24 DIAGNOSIS — Z0000 Encounter for general adult medical examination without abnormal findings: Secondary | ICD-10-CM | POA: Diagnosis not present

## 2023-03-21 DIAGNOSIS — F330 Major depressive disorder, recurrent, mild: Secondary | ICD-10-CM | POA: Diagnosis not present

## 2023-05-06 ENCOUNTER — Ambulatory Visit (HOSPITAL_COMMUNITY): Payer: Medicare Other

## 2023-05-13 DIAGNOSIS — E559 Vitamin D deficiency, unspecified: Secondary | ICD-10-CM | POA: Diagnosis not present

## 2023-05-13 DIAGNOSIS — E1169 Type 2 diabetes mellitus with other specified complication: Secondary | ICD-10-CM | POA: Diagnosis not present

## 2023-05-20 DIAGNOSIS — E1165 Type 2 diabetes mellitus with hyperglycemia: Secondary | ICD-10-CM | POA: Diagnosis not present

## 2023-05-20 DIAGNOSIS — Z23 Encounter for immunization: Secondary | ICD-10-CM | POA: Diagnosis not present

## 2023-05-20 DIAGNOSIS — Z0000 Encounter for general adult medical examination without abnormal findings: Secondary | ICD-10-CM | POA: Diagnosis not present

## 2023-05-20 DIAGNOSIS — I10 Essential (primary) hypertension: Secondary | ICD-10-CM | POA: Diagnosis not present

## 2023-05-20 DIAGNOSIS — E1169 Type 2 diabetes mellitus with other specified complication: Secondary | ICD-10-CM | POA: Diagnosis not present

## 2023-05-20 DIAGNOSIS — E782 Mixed hyperlipidemia: Secondary | ICD-10-CM | POA: Diagnosis not present

## 2023-06-05 DIAGNOSIS — E119 Type 2 diabetes mellitus without complications: Secondary | ICD-10-CM | POA: Diagnosis not present

## 2023-06-27 ENCOUNTER — Other Ambulatory Visit (HOSPITAL_COMMUNITY): Payer: Self-pay | Admitting: Internal Medicine

## 2023-06-27 DIAGNOSIS — Z1231 Encounter for screening mammogram for malignant neoplasm of breast: Secondary | ICD-10-CM

## 2023-07-23 DIAGNOSIS — J019 Acute sinusitis, unspecified: Secondary | ICD-10-CM | POA: Diagnosis not present

## 2023-07-23 DIAGNOSIS — J069 Acute upper respiratory infection, unspecified: Secondary | ICD-10-CM | POA: Diagnosis not present

## 2023-08-03 DIAGNOSIS — E119 Type 2 diabetes mellitus without complications: Secondary | ICD-10-CM | POA: Diagnosis not present

## 2023-08-15 DIAGNOSIS — E559 Vitamin D deficiency, unspecified: Secondary | ICD-10-CM | POA: Diagnosis not present

## 2023-08-15 DIAGNOSIS — E1169 Type 2 diabetes mellitus with other specified complication: Secondary | ICD-10-CM | POA: Diagnosis not present

## 2023-08-22 ENCOUNTER — Other Ambulatory Visit (HOSPITAL_COMMUNITY): Payer: Self-pay | Admitting: Nurse Practitioner

## 2023-08-22 DIAGNOSIS — F329 Major depressive disorder, single episode, unspecified: Secondary | ICD-10-CM | POA: Diagnosis not present

## 2023-08-22 DIAGNOSIS — Z0000 Encounter for general adult medical examination without abnormal findings: Secondary | ICD-10-CM | POA: Diagnosis not present

## 2023-08-22 DIAGNOSIS — Z0001 Encounter for general adult medical examination with abnormal findings: Secondary | ICD-10-CM | POA: Diagnosis not present

## 2023-08-22 DIAGNOSIS — M810 Age-related osteoporosis without current pathological fracture: Secondary | ICD-10-CM

## 2023-08-22 DIAGNOSIS — E1165 Type 2 diabetes mellitus with hyperglycemia: Secondary | ICD-10-CM | POA: Diagnosis not present

## 2023-08-22 DIAGNOSIS — E1169 Type 2 diabetes mellitus with other specified complication: Secondary | ICD-10-CM | POA: Diagnosis not present

## 2023-08-22 DIAGNOSIS — Z1231 Encounter for screening mammogram for malignant neoplasm of breast: Secondary | ICD-10-CM

## 2023-08-22 DIAGNOSIS — E782 Mixed hyperlipidemia: Secondary | ICD-10-CM | POA: Diagnosis not present

## 2023-08-22 DIAGNOSIS — G720 Drug-induced myopathy: Secondary | ICD-10-CM | POA: Diagnosis not present

## 2023-08-22 DIAGNOSIS — I10 Essential (primary) hypertension: Secondary | ICD-10-CM | POA: Diagnosis not present

## 2023-09-18 ENCOUNTER — Ambulatory Visit (HOSPITAL_COMMUNITY)
Admission: RE | Admit: 2023-09-18 | Discharge: 2023-09-18 | Disposition: A | Discharge status: Home / Self Care | Source: Ambulatory Visit | Attending: Nurse Practitioner | Admitting: Nurse Practitioner

## 2023-09-18 ENCOUNTER — Encounter (HOSPITAL_COMMUNITY): Payer: Self-pay

## 2023-09-18 DIAGNOSIS — M810 Age-related osteoporosis without current pathological fracture: Secondary | ICD-10-CM | POA: Diagnosis not present

## 2023-09-18 DIAGNOSIS — Z1231 Encounter for screening mammogram for malignant neoplasm of breast: Secondary | ICD-10-CM | POA: Insufficient documentation

## 2023-09-18 DIAGNOSIS — Z780 Asymptomatic menopausal state: Secondary | ICD-10-CM | POA: Diagnosis not present

## 2023-10-31 DIAGNOSIS — Z1211 Encounter for screening for malignant neoplasm of colon: Secondary | ICD-10-CM | POA: Diagnosis not present

## 2023-11-06 LAB — COLOGUARD: COLOGUARD: POSITIVE — AB

## 2023-11-20 ENCOUNTER — Encounter: Payer: Self-pay | Admitting: Internal Medicine

## 2023-11-22 DIAGNOSIS — E1169 Type 2 diabetes mellitus with other specified complication: Secondary | ICD-10-CM | POA: Diagnosis not present

## 2023-11-27 DIAGNOSIS — F329 Major depressive disorder, single episode, unspecified: Secondary | ICD-10-CM | POA: Diagnosis not present

## 2023-11-27 DIAGNOSIS — E1169 Type 2 diabetes mellitus with other specified complication: Secondary | ICD-10-CM | POA: Diagnosis not present

## 2023-11-27 DIAGNOSIS — E782 Mixed hyperlipidemia: Secondary | ICD-10-CM | POA: Diagnosis not present

## 2023-11-27 DIAGNOSIS — I10 Essential (primary) hypertension: Secondary | ICD-10-CM | POA: Diagnosis not present

## 2023-12-04 ENCOUNTER — Ambulatory Visit (INDEPENDENT_AMBULATORY_CARE_PROVIDER_SITE_OTHER): Admitting: Internal Medicine

## 2023-12-04 ENCOUNTER — Encounter: Payer: Self-pay | Admitting: Internal Medicine

## 2023-12-04 VITALS — BP 136/67 | HR 94 | Temp 97.8°F | Ht 65.0 in | Wt 183.2 lb

## 2023-12-04 DIAGNOSIS — R195 Other fecal abnormalities: Secondary | ICD-10-CM

## 2023-12-04 DIAGNOSIS — Z1211 Encounter for screening for malignant neoplasm of colon: Secondary | ICD-10-CM

## 2023-12-04 NOTE — Progress Notes (Signed)
 Primary Care Physician:  Shona Norleen PEDLAR, MD Primary Gastroenterologist:  Dr. Cindie  Chief Complaint  Patient presents with   New Patient (Initial Visit)    Patient here today as a new patient. Patient had a colo guard and it came back normal. She says she takes metformin  and with in a couple of hours she has to run to the bathroom with loose stools.     HPI:   [name] is a 73 y.o. female who presents to clinic today by referral from her PCP Dr. Shona for evaluation.  Recently underwent Cologuard testing for colon cancer screening which was positive.  Patient denies any gross melena hematochezia.  No family history of colorectal malignancy.  No unintentional weight loss.  No abdominal pain.  Denies any upper GI symptoms including heartburn, reflux, dysphagia/odynophagia, epigastric or chest pain.  Last colonoscopy 2006 which was reportedly normal.  Past Medical History:  Diagnosis Date   Dermatitis    Diabetes mellitus    Hyperlipidemia    Hypertension    IGT (impaired glucose tolerance)    Obesity    Spinal stenosis    Urinary frequency 02/28/2015   UTI (lower urinary tract infection) 02/28/2015    Past Surgical History:  Procedure Laterality Date   BREAST BIOPSY Right 06/04/2001   benign   right breast biopsy calcification  benign  06/04/2001   spinal ablation  11/02/2014   TUBAL LIGATION      Current Outpatient Medications  Medication Sig Dispense Refill   atenolol  (TENORMIN ) 25 MG tablet Take 25 mg by mouth daily.     citalopram  (CELEXA ) 20 MG tablet Take 20 mg by mouth daily.     glipiZIDE (GLUCOTROL XL) 5 MG 24 hr tablet Take 5 mg by mouth daily with breakfast.     lisinopril  (ZESTRIL ) 20 MG tablet Take 20 mg by mouth daily.     metFORMIN  (GLUCOPHAGE ) 500 MG tablet Take by mouth 2 (two) times daily with a meal.     OVER THE COUNTER MEDICATION Otc Vit D daily.     No current facility-administered medications for this visit.    Allergies as of 12/04/2023  - Review Complete 12/04/2023  Allergen Reaction Noted   Codeine Nausea And Vomiting 10/03/2010   Sulfonamide derivatives Nausea And Vomiting     Family History  Problem Relation Age of Onset   Addison's disease Mother    Diabetes Mother    Heart disease Mother    Stroke Father    Heart attack Father    Heart disease Father    Irregular heart beat Sister    Hypertension Sister    Heart disease Sister    Diabetes Sister    Hypertension Sister    Coronary artery disease Brother    Heart disease Brother    Diabetes Brother    Hypertension Brother     Social History   Socioeconomic History   Marital status: Widowed    Spouse name: Not on file   Number of children: 1   Years of education: Not on file   Highest education level: Not on file  Occupational History   Occupation: employed   Tobacco Use   Smoking status: Never   Smokeless tobacco: Never  Vaping Use   Vaping status: Never Used  Substance and Sexual Activity   Alcohol use: No   Drug use: No   Sexual activity: Yes    Birth control/protection: Surgical    Comment: tubal  Other  Topics Concern   Not on file  Social History Narrative   Not on file   Social Drivers of Health   Financial Resource Strain: Not on file  Food Insecurity: Not on file  Transportation Needs: Not on file  Physical Activity: Not on file  Stress: Not on file  Social Connections: Not on file  Intimate Partner Violence: Not on file    Subjective: Review of Systems  Constitutional:  Negative for chills and fever.  HENT:  Negative for congestion and hearing loss.   Eyes:  Negative for blurred vision and double vision.  Respiratory:  Negative for cough and shortness of breath.   Cardiovascular:  Negative for chest pain and palpitations.  Gastrointestinal:  Negative for abdominal pain, blood in stool, constipation, diarrhea, heartburn, melena and vomiting.  Genitourinary:  Negative for dysuria and urgency.  Musculoskeletal:  Negative  for joint pain and myalgias.  Skin:  Negative for itching and rash.  Neurological:  Negative for dizziness and headaches.  Psychiatric/Behavioral:  Negative for depression. The patient is not nervous/anxious.        Objective: BP 136/67 (BP Location: Left Arm, Patient Position: Sitting, Cuff Size: Large)   Pulse 94   Temp 97.8 F (36.6 C) (Temporal)   Ht 5' 5 (1.651 m)   Wt 183 lb 3.2 oz (83.1 kg)   BMI 30.49 kg/m  Physical Exam Constitutional:      Appearance: Normal appearance.  HENT:     Head: Normocephalic and atraumatic.  Eyes:     Extraocular Movements: Extraocular movements intact.     Conjunctiva/sclera: Conjunctivae normal.  Cardiovascular:     Rate and Rhythm: Normal rate and regular rhythm.  Pulmonary:     Effort: Pulmonary effort is normal.     Breath sounds: Normal breath sounds.  Abdominal:     General: Bowel sounds are normal.     Palpations: Abdomen is soft.  Musculoskeletal:        General: No swelling. Normal range of motion.     Cervical back: Normal range of motion and neck supple.  Skin:    General: Skin is warm and dry.     Coloration: Skin is not jaundiced.  Neurological:     General: No focal deficit present.     Mental Status: She is alert and oriented to person, place, and time.  Psychiatric:        Mood and Affect: Mood normal.        Behavior: Behavior normal.      Assessment: *Colon cancer screening *Positive Cologuard testing  Plan: Will schedule for screening colonoscopy.The risks including infection, bleed, or perforation as well as benefits, limitations, alternatives and imponderables have been reviewed with the patient. Questions have been answered. All parties agreeable.  Thank you Dr. Shona for the kind referral.  12/04/2023 3:28 PM   Disclaimer: This note was dictated with voice recognition software. Similar sounding words can inadvertently be transcribed and may not be corrected upon review.

## 2023-12-04 NOTE — H&P (View-Only) (Signed)
 Primary Care Physician:  Shona Norleen PEDLAR, MD Primary Gastroenterologist:  Dr. Cindie  Chief Complaint  Patient presents with   New Patient (Initial Visit)    Patient here today as a new patient. Patient had a colo guard and it came back normal. She says she takes metformin  and with in a couple of hours she has to run to the bathroom with loose stools.     HPI:   [name] is a 73 y.o. female who presents to clinic today by referral from her PCP Dr. Shona for evaluation.  Recently underwent Cologuard testing for colon cancer screening which was positive.  Patient denies any gross melena hematochezia.  No family history of colorectal malignancy.  No unintentional weight loss.  No abdominal pain.  Denies any upper GI symptoms including heartburn, reflux, dysphagia/odynophagia, epigastric or chest pain.  Last colonoscopy 2006 which was reportedly normal.  Past Medical History:  Diagnosis Date   Dermatitis    Diabetes mellitus    Hyperlipidemia    Hypertension    IGT (impaired glucose tolerance)    Obesity    Spinal stenosis    Urinary frequency 02/28/2015   UTI (lower urinary tract infection) 02/28/2015    Past Surgical History:  Procedure Laterality Date   BREAST BIOPSY Right 06/04/2001   benign   right breast biopsy calcification  benign  06/04/2001   spinal ablation  11/02/2014   TUBAL LIGATION      Current Outpatient Medications  Medication Sig Dispense Refill   atenolol  (TENORMIN ) 25 MG tablet Take 25 mg by mouth daily.     citalopram  (CELEXA ) 20 MG tablet Take 20 mg by mouth daily.     glipiZIDE (GLUCOTROL XL) 5 MG 24 hr tablet Take 5 mg by mouth daily with breakfast.     lisinopril  (ZESTRIL ) 20 MG tablet Take 20 mg by mouth daily.     metFORMIN  (GLUCOPHAGE ) 500 MG tablet Take by mouth 2 (two) times daily with a meal.     OVER THE COUNTER MEDICATION Otc Vit D daily.     No current facility-administered medications for this visit.    Allergies as of 12/04/2023  - Review Complete 12/04/2023  Allergen Reaction Noted   Codeine Nausea And Vomiting 10/03/2010   Sulfonamide derivatives Nausea And Vomiting     Family History  Problem Relation Age of Onset   Addison's disease Mother    Diabetes Mother    Heart disease Mother    Stroke Father    Heart attack Father    Heart disease Father    Irregular heart beat Sister    Hypertension Sister    Heart disease Sister    Diabetes Sister    Hypertension Sister    Coronary artery disease Brother    Heart disease Brother    Diabetes Brother    Hypertension Brother     Social History   Socioeconomic History   Marital status: Widowed    Spouse name: Not on file   Number of children: 1   Years of education: Not on file   Highest education level: Not on file  Occupational History   Occupation: employed   Tobacco Use   Smoking status: Never   Smokeless tobacco: Never  Vaping Use   Vaping status: Never Used  Substance and Sexual Activity   Alcohol use: No   Drug use: No   Sexual activity: Yes    Birth control/protection: Surgical    Comment: tubal  Other  Topics Concern   Not on file  Social History Narrative   Not on file   Social Drivers of Health   Financial Resource Strain: Not on file  Food Insecurity: Not on file  Transportation Needs: Not on file  Physical Activity: Not on file  Stress: Not on file  Social Connections: Not on file  Intimate Partner Violence: Not on file    Subjective: Review of Systems  Constitutional:  Negative for chills and fever.  HENT:  Negative for congestion and hearing loss.   Eyes:  Negative for blurred vision and double vision.  Respiratory:  Negative for cough and shortness of breath.   Cardiovascular:  Negative for chest pain and palpitations.  Gastrointestinal:  Negative for abdominal pain, blood in stool, constipation, diarrhea, heartburn, melena and vomiting.  Genitourinary:  Negative for dysuria and urgency.  Musculoskeletal:  Negative  for joint pain and myalgias.  Skin:  Negative for itching and rash.  Neurological:  Negative for dizziness and headaches.  Psychiatric/Behavioral:  Negative for depression. The patient is not nervous/anxious.        Objective: BP 136/67 (BP Location: Left Arm, Patient Position: Sitting, Cuff Size: Large)   Pulse 94   Temp 97.8 F (36.6 C) (Temporal)   Ht 5' 5 (1.651 m)   Wt 183 lb 3.2 oz (83.1 kg)   BMI 30.49 kg/m  Physical Exam Constitutional:      Appearance: Normal appearance.  HENT:     Head: Normocephalic and atraumatic.  Eyes:     Extraocular Movements: Extraocular movements intact.     Conjunctiva/sclera: Conjunctivae normal.  Cardiovascular:     Rate and Rhythm: Normal rate and regular rhythm.  Pulmonary:     Effort: Pulmonary effort is normal.     Breath sounds: Normal breath sounds.  Abdominal:     General: Bowel sounds are normal.     Palpations: Abdomen is soft.  Musculoskeletal:        General: No swelling. Normal range of motion.     Cervical back: Normal range of motion and neck supple.  Skin:    General: Skin is warm and dry.     Coloration: Skin is not jaundiced.  Neurological:     General: No focal deficit present.     Mental Status: She is alert and oriented to person, place, and time.  Psychiatric:        Mood and Affect: Mood normal.        Behavior: Behavior normal.      Assessment: *Colon cancer screening *Positive Cologuard testing  Plan: Will schedule for screening colonoscopy.The risks including infection, bleed, or perforation as well as benefits, limitations, alternatives and imponderables have been reviewed with the patient. Questions have been answered. All parties agreeable.  Thank you Dr. Shona for the kind referral.  12/04/2023 3:28 PM   Disclaimer: This note was dictated with voice recognition software. Similar sounding words can inadvertently be transcribed and may not be corrected upon review.

## 2023-12-04 NOTE — Patient Instructions (Signed)
 We will you for colonoscopy for colon cancer screening purposes.  It was very nice meeting you today.  Dr. Cindie

## 2023-12-05 ENCOUNTER — Telehealth: Payer: Self-pay | Admitting: *Deleted

## 2023-12-05 ENCOUNTER — Other Ambulatory Visit: Payer: Self-pay | Admitting: *Deleted

## 2023-12-05 ENCOUNTER — Encounter: Payer: Self-pay | Admitting: *Deleted

## 2023-12-05 MED ORDER — PEG 3350-KCL-NA BICARB-NACL 420 G PO SOLR: 4000.0000 mL | Freq: Once | ORAL | 0 refills | Status: AC

## 2023-12-05 NOTE — Telephone Encounter (Signed)
 LMOVM to return call   TCS w/Dr.Carver, asa 2

## 2023-12-13 ENCOUNTER — Encounter: Payer: Self-pay | Admitting: *Deleted

## 2023-12-13 NOTE — Telephone Encounter (Signed)
 LMOVM to return call   Pt left vm that she had a cold and is coughing really bad. She has procedure scheduled for 12/17/23.

## 2023-12-13 NOTE — Telephone Encounter (Signed)
 Pt called and stated she needed to reschedule her procedure on 12/17/23 due to being sick and coughing really bad. She has been rescheduled for 01/01/24 at 8:45 am.

## 2023-12-14 ENCOUNTER — Encounter: Payer: Self-pay | Admitting: Emergency Medicine

## 2023-12-14 ENCOUNTER — Other Ambulatory Visit: Payer: Self-pay

## 2023-12-14 ENCOUNTER — Ambulatory Visit: Admission: EM | Admit: 2023-12-14 | Discharge: 2023-12-14 | Disposition: A | Discharge status: Home / Self Care

## 2023-12-14 DIAGNOSIS — J069 Acute upper respiratory infection, unspecified: Secondary | ICD-10-CM

## 2023-12-14 MED ORDER — AZELASTINE HCL 0.1 % NA SOLN: 1.0000 | Freq: Two times a day (BID) | NASAL | 0 refills | Status: AC

## 2023-12-14 MED ORDER — PROMETHAZINE-DM 6.25-15 MG/5ML PO SYRP: 5.0000 mL | ORAL_SOLUTION | Freq: Four times a day (QID) | ORAL | 0 refills | Status: AC | PRN

## 2023-12-14 NOTE — Discharge Instructions (Addendum)
 In addition to the prescribed medications, you may use Coricidin HBP, saline sinus rinses, humidifiers, vapor rubs and drink plenty of fluids and get lots of rest.  Follow-up for significantly worsening symptoms.

## 2023-12-14 NOTE — ED Triage Notes (Addendum)
 Pt reports runny nose, cough, nasal drainage/congestion, generalized weakness, fatigue for last several days. Denies any known fever or chills.

## 2023-12-14 NOTE — ED Provider Notes (Signed)
 [facility]    CSN: [id number] Arrival date & time: 12/14/23  1224      History   Chief Complaint Chief Complaint  Patient presents with   Nasal Congestion    HPI [name] is a 73 y.o. female.   Pt reports runny nose, cough, nasal drainage/congestion, generalized weakness, fatigue for last several days. Denies any known fever or chills.      Past Medical History:  Diagnosis Date   Dermatitis    Diabetes mellitus    Hyperlipidemia    Hypertension    IGT (impaired glucose tolerance)    Obesity    Spinal stenosis    Urinary frequency 02/28/2015   UTI (lower urinary tract infection) 02/28/2015    Patient Active Problem List   Diagnosis Date Noted   Urinary frequency 02/28/2015   Lower urinary tract infectious disease 02/28/2015   Lumbago 03/30/2013   Gluteal pain 03/30/2013   Hip pain, right 06/09/2012   Cystitis, acute 11/09/2011   Hip pain 10/23/2011   SI (sacroiliac) joint inflammation (HCC) 10/23/2011   Depressive disorder, not elsewhere classified 10/08/2010   Prediabetes 08/16/2009   FATIGUE 03/10/2009   VARICOSE VEINS LOWER EXTREMITIES W/OTH COMPS 12/09/2008   HYPERLIPIDEMIA 11/27/2007   OBESITY 11/27/2007   HYPERTENSION 11/27/2007   DERMATITIS 11/27/2007    Past Surgical History:  Procedure Laterality Date   BREAST BIOPSY Right 06/04/2001   benign   right breast biopsy calcification  benign  06/04/2001   spinal ablation  11/02/2014   TUBAL LIGATION      OB History     Gravida  1   Para  1   Term      Preterm      AB      Living  1      SAB      IAB      Ectopic      Multiple      Live Births               Home Medications    Prior to Admission medications   Medication Sig Start Date End Date Taking? Authorizing Provider  azelastine  (ASTELIN ) 0.1 % nasal spray Place 1 spray into both nostrils 2 (two) times daily. Use in each nostril as directed 12/14/23  Yes Stuart Vernell Norris, PA-C   cetirizine  (ZYRTEC  ALLERGY) 10 MG tablet take one tablet daily for allergies take one tablet daily for allergies 09/01/18  Yes [provider]  ezetimibe (ZETIA) 10 MG tablet Take 1 tablet every day by oral route for 90 days. 11/27/23  Yes [provider]  promethazine -dextromethorphan (PROMETHAZINE -DM) 6.25-15 MG/5ML syrup Take 5 mLs by mouth 4 (four) times daily as needed. 12/14/23  Yes Stuart Vernell Norris, PA-C  atenolol  (TENORMIN ) 25 MG tablet Take 25 mg by mouth daily.    [provider]  citalopram  (CELEXA ) 20 MG tablet Take 20 mg by mouth daily.    [provider]  glipiZIDE (GLUCOTROL XL) 5 MG 24 hr tablet Take 5 mg by mouth daily with breakfast.    [provider]  lisinopril  (ZESTRIL ) 20 MG tablet Take 20 mg by mouth daily.    [provider]  meclizine (ANTIVERT) 12.5 MG tablet TAKE 1 TABLET BY MOUTH TWICE DAILY FOR 14 DAYS AS NEEDED    [provider]  metFORMIN  (GLUCOPHAGE ) 500 MG tablet Take by mouth 2 (two) times daily with a meal.    [provider]  OVER THE  COUNTER MEDICATION Otc Vit D daily.    [provider]    Family History Family History  Problem Relation Age of Onset   Addison's disease Mother    Diabetes Mother    Heart disease Mother    Stroke Father    Heart attack Father    Heart disease Father    Irregular heart beat Sister    Hypertension Sister    Heart disease Sister    Diabetes Sister    Hypertension Sister    Coronary artery disease Brother    Heart disease Brother    Diabetes Brother    Hypertension Brother     Social History Social History   Tobacco Use   Smoking status: Never   Smokeless tobacco: Never  Vaping Use   Vaping status: Never Used  Substance Use Topics   Alcohol use: No   Drug use: No     Allergies   Codeine and Sulfonamide derivatives   Review of Systems Review of Systems PER HPI  Physical Exam Triage Vital Signs ED Triage Vitals   Encounter Vitals Group     BP 12/14/23 1416 100/64     Girls Systolic BP Percentile --      Girls Diastolic BP Percentile --      Boys Systolic BP Percentile --      Boys Diastolic BP Percentile --      Pulse Rate 12/14/23 1416 62     Resp 12/14/23 1412 20     Temp 12/14/23 1416 97.8 F (36.6 C)     Temp Source 12/14/23 1412 Oral     SpO2 12/14/23 1416 95 %     Weight --      Height --      Head Circumference --      Peak Flow --      Pain Score 12/14/23 1413 0     Pain Loc --      Pain Education --      Exclude from Growth Chart --    No data found.  Updated Vital Signs BP 100/64 (BP Location: Right Arm)   Pulse 62   Temp 97.8 F (36.6 C) (Oral)   Resp 20   SpO2 95%   Visual Acuity Right Eye Distance:   Left Eye Distance:   Bilateral Distance:    Right Eye Near:   Left Eye Near:    Bilateral Near:     Physical Exam Vitals and nursing note reviewed.  Constitutional:      Appearance: Normal appearance.  HENT:     Head: Atraumatic.     Right Ear: Tympanic membrane and external ear normal.     Left Ear: Tympanic membrane and external ear normal.     Nose: Rhinorrhea present.     Mouth/Throat:     Mouth: Mucous membranes are moist.     Pharynx: Posterior oropharyngeal erythema present.  Eyes:     Extraocular Movements: Extraocular movements intact.     Conjunctiva/sclera: Conjunctivae normal.  Cardiovascular:     Rate and Rhythm: Normal rate and regular rhythm.     Heart sounds: Normal heart sounds.  Pulmonary:     Effort: Pulmonary effort is normal.     Breath sounds: Normal breath sounds. No wheezing or rales.  Musculoskeletal:        General: Normal range of motion.     Cervical back: Normal range of motion and neck supple.  Skin:    General: Skin is warm and  dry.  Neurological:     Mental Status: She is alert and oriented to person, place, and time.  Psychiatric:        Mood and Affect: Mood normal.        Thought Content: Thought content  normal.      UC Treatments / Results  Labs (all labs ordered are listed, but only abnormal results are displayed) Labs Reviewed - No data to display  EKG   Radiology No results found.  Procedures Procedures (including critical care time)  Medications Ordered in UC Medications - No data to display  Initial Impression / Assessment and Plan / UC Course  I have reviewed the triage vital signs and the nursing notes.  Pertinent labs & imaging results that were available during my care of the patient were reviewed by me and considered in my medical decision making (see chart for details).     Signs and exam very reassuring today and suspicious for a viral respiratory infection.  Will treat with Phenergan  DM, Astelin , supportive over-the-counter medications and home care.  Return for worsening symptoms.  Final Clinical Impressions(s) / UC Diagnoses   Final diagnoses:  Viral URI with cough     Discharge Instructions      In addition to the prescribed medications, you may use Coricidin HBP, saline sinus rinses, humidifiers, vapor rubs and drink plenty of fluids and get lots of rest.  Follow-up for significantly worsening symptoms.     ED Prescriptions     Medication Sig Dispense Auth. Provider   promethazine -dextromethorphan (PROMETHAZINE -DM) 6.25-15 MG/5ML syrup Take 5 mLs by mouth 4 (four) times daily as needed. 100 mL Stuart Vernell Norris, PA-C   azelastine  (ASTELIN ) 0.1 % nasal spray Place 1 spray into both nostrils 2 (two) times daily. Use in each nostril as directed 30 mL Stuart Vernell Norris, PA-C      PDMP not reviewed this encounter.   Stuart Vernell Norris, [place] 12/14/23 1450

## 2023-12-19 ENCOUNTER — Ambulatory Visit: Admitting: Nutrition

## 2023-12-30 ENCOUNTER — Encounter (HOSPITAL_COMMUNITY)
Admission: RE | Admit: 2023-12-30 | Discharge: 2023-12-30 | Disposition: A | Discharge status: Home / Self Care | Source: Ambulatory Visit | Attending: Internal Medicine | Admitting: Internal Medicine

## 2023-12-30 HISTORY — DX: Depression, unspecified: F32.A

## 2023-12-30 HISTORY — DX: Anemia, unspecified: D64.9

## 2023-12-30 HISTORY — DX: Anxiety disorder, unspecified: F41.9

## 2023-12-31 ENCOUNTER — Encounter (HOSPITAL_COMMUNITY): Payer: Self-pay

## 2024-01-01 ENCOUNTER — Ambulatory Visit (HOSPITAL_COMMUNITY)
Admission: RE | Admit: 2024-01-01 | Discharge: 2024-01-01 | Disposition: A | Discharge status: Home / Self Care | Attending: Internal Medicine | Admitting: Internal Medicine

## 2024-01-01 ENCOUNTER — Ambulatory Visit (HOSPITAL_COMMUNITY): Payer: Self-pay | Admitting: Anesthesiology

## 2024-01-01 ENCOUNTER — Encounter (HOSPITAL_COMMUNITY): Payer: Self-pay | Admitting: Anesthesiology

## 2024-01-01 ENCOUNTER — Encounter (HOSPITAL_COMMUNITY)
Admission: RE | Disposition: A | Discharge status: Home / Self Care | Payer: Self-pay | Source: Home / Self Care | Attending: Internal Medicine

## 2024-01-01 ENCOUNTER — Encounter (HOSPITAL_COMMUNITY): Payer: Self-pay | Admitting: Internal Medicine

## 2024-01-01 DIAGNOSIS — R195 Other fecal abnormalities: Secondary | ICD-10-CM | POA: Diagnosis not present

## 2024-01-01 DIAGNOSIS — D122 Benign neoplasm of ascending colon: Secondary | ICD-10-CM | POA: Insufficient documentation

## 2024-01-01 DIAGNOSIS — K5730 Diverticulosis of large intestine without perforation or abscess without bleeding: Secondary | ICD-10-CM

## 2024-01-01 DIAGNOSIS — D125 Benign neoplasm of sigmoid colon: Secondary | ICD-10-CM | POA: Diagnosis not present

## 2024-01-01 DIAGNOSIS — Z1211 Encounter for screening for malignant neoplasm of colon: Secondary | ICD-10-CM

## 2024-01-01 DIAGNOSIS — K649 Unspecified hemorrhoids: Secondary | ICD-10-CM

## 2024-01-01 DIAGNOSIS — I10 Essential (primary) hypertension: Secondary | ICD-10-CM | POA: Diagnosis not present

## 2024-01-01 DIAGNOSIS — Z7984 Long term (current) use of oral hypoglycemic drugs: Secondary | ICD-10-CM | POA: Diagnosis not present

## 2024-01-01 DIAGNOSIS — K635 Polyp of colon: Secondary | ICD-10-CM

## 2024-01-01 DIAGNOSIS — E119 Type 2 diabetes mellitus without complications: Secondary | ICD-10-CM | POA: Insufficient documentation

## 2024-01-01 DIAGNOSIS — D120 Benign neoplasm of cecum: Secondary | ICD-10-CM | POA: Diagnosis not present

## 2024-01-01 DIAGNOSIS — K648 Other hemorrhoids: Secondary | ICD-10-CM | POA: Insufficient documentation

## 2024-01-01 LAB — GLUCOSE, CAPILLARY: Glucose-Capillary: 250 mg/dL — ABNORMAL HIGH (ref 70–99)

## 2024-01-01 SURGERY — COLONOSCOPY
Anesthesia: General

## 2024-01-01 MED ORDER — PROPOFOL 500 MG/50ML IV EMUL
INTRAVENOUS | Status: DC | PRN
  Administered 2024-01-01: 150 ug/kg/min via INTRAVENOUS

## 2024-01-01 MED ORDER — LACTATED RINGERS IV SOLN: INTRAVENOUS | Status: DC

## 2024-01-01 MED ORDER — PHENYLEPHRINE 80 MCG/ML (10ML) SYRINGE FOR IV PUSH (FOR BLOOD PRESSURE SUPPORT)
PREFILLED_SYRINGE | INTRAVENOUS | Status: DC | PRN
Start: 2024-01-01 — End: 2024-01-01
  Administered 2024-01-01 (×3): 160 ug via INTRAVENOUS

## 2024-01-01 MED ORDER — PROPOFOL 10 MG/ML IV BOLUS
INTRAVENOUS | Status: DC | PRN
  Administered 2024-01-01: 60 mg via INTRAVENOUS

## 2024-01-01 NOTE — Discharge Instructions (Addendum)
  Colonoscopy Discharge Instructions  Read the instructions outlined below and refer to this sheet in the next few weeks. These discharge instructions provide you with general information on caring for yourself after you leave the hospital. Your doctor may also give you specific instructions. While your treatment has been planned according to the most current medical practices available, unavoidable complications occasionally occur.   ACTIVITY You may resume your regular activity, but move at a slower pace for the next 24 hours.  Take frequent rest periods for the next 24 hours.  Walking will help get rid of the air and reduce the bloated feeling in your belly (abdomen).  No driving for 24 hours (because of the medicine (anesthesia) used during the test).   Do not sign any important legal documents or operate any machinery for 24 hours (because of the anesthesia used during the test).  NUTRITION Drink plenty of fluids.  You may resume your normal diet as instructed by your doctor.  Begin with a light meal and progress to your normal diet. Heavy or fried foods are harder to digest and may make you feel sick to your stomach (nauseated).  Avoid alcoholic beverages for 24 hours or as instructed.  MEDICATIONS You may resume your normal medications unless your doctor tells you otherwise.  WHAT YOU CAN EXPECT TODAY Some feelings of bloating in the abdomen.  Passage of more gas than usual.  Spotting of blood in your stool or on the toilet paper.  IF YOU HAD POLYPS REMOVED DURING THE COLONOSCOPY: No aspirin products for 7 days or as instructed.  No alcohol for 7 days or as instructed.  Eat a soft diet for the next 24 hours.  FINDING OUT THE RESULTS OF YOUR TEST Not all test results are available during your visit. If your test results are not back during the visit, make an appointment with your caregiver to find out the results. Do not assume everything is normal if you have not heard from your  caregiver or the medical facility. It is important for you to follow up on all of your test results.  SEEK IMMEDIATE MEDICAL ATTENTION IF: You have more than a spotting of blood in your stool.  Your belly is swollen (abdominal distention).  You are nauseated or vomiting.  You have a temperature over 101.  You have abdominal pain or discomfort that is severe or gets worse throughout the day.   Your colonoscopy revealed 3 polyp(s) which I removed successfully. Await pathology results, my office will contact you.  Given your age, I do not think you need further colonoscopies for polyp surveillance.  You also have diverticulosis and internal hemorrhoids. I would recommend increasing fiber in your diet or adding OTC Benefiber/Metamucil. Be sure to drink at least 4 to 6 glasses of water daily. Follow-up with GI as needed.   I hope you have a great rest of your week!  Carlin POUR. Cindie, D.O. Gastroenterology and Hepatology [facility]

## 2024-01-01 NOTE — Progress Notes (Signed)
 Dr.Kiel notified of cbg 250.  No further orders.

## 2024-01-01 NOTE — Transfer of Care (Signed)
 Immediate Anesthesia Transfer of Care Note  Patient: [name]  Procedure(s) Performed: COLONOSCOPY  Patient Location: Short Stay  Anesthesia Type:General  Level of Consciousness: awake, alert , oriented, and patient cooperative  Airway & Oxygen Therapy: Patient Spontanous Breathing  Post-op Assessment: Report given to RN, Post -op Vital signs reviewed and stable, and Patient moving all extremities X 4  Post vital signs: Reviewed and stable  Last Vitals:  Vitals Value Taken Time  BP    Temp    Pulse    Resp    SpO2      Last Pain:  Vitals:   01/01/24 0911  PainSc: 0-No pain         Complications: No notable events documented.

## 2024-01-01 NOTE — Interval H&P Note (Signed)
 History and Physical Interval Note:  01/01/2024 8:34 AM  [name]  has presented today for surgery, with the diagnosis of positive cologuard.  The various methods of treatment have been discussed with the patient and family. After consideration of risks, benefits and other options for treatment, the patient has consented to  Procedure(s) with comments: COLONOSCOPY (N/A) - 2:15 PM, ASA 2 as a surgical intervention.  The patient's history has been reviewed, patient examined, no change in status, stable for surgery.  I have reviewed the patient's chart and labs.  Questions were answered to the patient's satisfaction.     [name]

## 2024-01-01 NOTE — Anesthesia Preprocedure Evaluation (Signed)
 Anesthesia Evaluation  Patient identified by MRN, date of birth, ID band Patient awake    Reviewed: Allergy & Precautions, H&P , NPO status , Patient's Chart, lab work & pertinent test results, reviewed documented beta blocker date and time   Airway Mallampati: II  TM Distance: >3 FB Neck ROM: full    Dental no notable dental hx.    Pulmonary neg pulmonary ROS   Pulmonary exam normal breath sounds clear to auscultation       Cardiovascular Exercise Tolerance: Good hypertension, negative cardio ROS  Rhythm:regular Rate:Normal     Neuro/Psych  PSYCHIATRIC DISORDERS Anxiety Depression    negative neurological ROS  negative psych ROS   GI/Hepatic negative GI ROS, Neg liver ROS,,,  Endo/Other  negative endocrine ROSdiabetes    Renal/GU negative Renal ROS  negative genitourinary   Musculoskeletal   Abdominal   Peds  Hematology negative hematology ROS (+) Blood dyscrasia, anemia   Anesthesia Other Findings   Reproductive/Obstetrics negative OB ROS                              Anesthesia Physical Anesthesia Plan  ASA: 3  Anesthesia Plan: General   Post-op Pain Management:    Induction:   PONV Risk Score and Plan: Propofol  infusion  Airway Management Planned:   Additional Equipment:   Intra-op Plan:   Post-operative Plan:   Informed Consent: I have reviewed the patients History and Physical, chart, labs and discussed the procedure including the risks, benefits and alternatives for the proposed anesthesia with the patient or authorized representative who has indicated his/her understanding and acceptance.     Dental Advisory Given  Plan Discussed with: CRNA  Anesthesia Plan Comments:         Anesthesia Quick Evaluation

## 2024-01-01 NOTE — Op Note (Signed)
 [facility] Patient Name: [name] Procedure Date: 01/01/2024 9:01 AM MRN: [id number] Date of Birth: [birth year] Attending MD: Carlin POUR. Cindie , [place], [id number] CSN: [id number] Age: 73 Admit Type: Outpatient Procedure:                Colonoscopy Indications:              Screening for colorectal malignant neoplasm,                            Incidental - Positive Cologuard test Providers:                Carlin POUR. Cindie, DO, Crystal Page, Daphne Mulch                            Technician, Technician Referring MD:             Carlin POUR. Cindie, DO Medicines:                See the Anesthesia note for documentation of the                            administered medications Complications:            No immediate complications. Estimated Blood Loss:     Estimated blood loss was minimal. Procedure:                Pre-Anesthesia Assessment:                           - The anesthesia plan was to use monitored                            anesthesia care (MAC).                           After obtaining informed consent, the colonoscope                            was passed under direct vision. Throughout the                            procedure, the patient's blood pressure, pulse, and                            oxygen saturations were monitored continuously. The                            [id number]-[id number] ([id number]) was introduced through the                            anus and advanced to the the cecum, identified by                            appendiceal orifice and ileocecal valve. The                            colonoscopy was performed without  difficulty. The                            patient tolerated the procedure well. The quality                            of the bowel preparation was evaluated using the                            BBPS [facility] Bowel Preparation Scale) with scores                            of: Right Colon = 3, Transverse Colon = 3 and Left                             Colon = 3 (entire mucosa seen well with no residual                            staining, small fragments of stool or opaque                            liquid). The total BBPS score equals 9. Scope In: 9:16:38 AM Scope Out: 9:29:48 AM Scope Withdrawal Time: 0 hours 10 minutes 41 seconds  Total Procedure Duration: 0 hours 13 minutes 10 seconds  Findings:      Non-bleeding internal hemorrhoids were found.      Multiple large-mouthed and small-mouthed diverticula were found in the       sigmoid colon, descending colon and ascending colon.      An 8 mm polyp was found in the sigmoid colon. The polyp was sessile. The       polyp was removed with a cold snare. Resection and retrieval were       complete.      Two sessile polyps were found in the ascending colon and cecum. The       polyps were 4 to 5 mm in size. These polyps were removed with a cold       snare. Resection and retrieval were complete. Impression:               - Non-bleeding internal hemorrhoids.                           - Diverticulosis in the sigmoid colon, in the                            descending colon and in the ascending colon.                           - One 8 mm polyp in the sigmoid colon, removed with                            a cold snare. Resected and retrieved.                           - Two 4 to 5 mm polyps in  the ascending colon and                            in the cecum, removed with a cold snare. Resected                            and retrieved. Moderate Sedation:      Per Anesthesia Care Recommendation:           - Patient has a contact number available for                            emergencies. The signs and symptoms of potential                            delayed complications were discussed with the                            patient. Return to normal activities tomorrow.                            Written discharge instructions were provided to the                            patient.                            - Resume previous diet.                           - Continue present medications.                           - Await pathology results.                           - No repeat colonoscopy due to age.                           - Return to GI clinic PRN. Procedure Code(s):        --- Professional ---                           [phone], Colonoscopy, flexible; with removal of                            tumor(s), polyp(s), or other lesion(s) by snare                            technique Diagnosis Code(s):        --- Professional ---                           Z12.11, Encounter for screening for malignant                            neoplasm of colon  K64.8, Other hemorrhoids                           D12.5, Benign neoplasm of sigmoid colon                           D12.2, Benign neoplasm of ascending colon                           D12.0, Benign neoplasm of cecum                           K57.30, Diverticulosis of large intestine without                            perforation or abscess without bleeding CPT copyright 2022 American Medical Association. All rights reserved. The codes documented in this report are preliminary and upon coder review may  be revised to meet current compliance requirements. Carlin POUR. Cindie, DO Carlin POUR. Cindie, DO 01/01/2024 9:34:23 AM This report has been signed electronically. Number of Addenda: 0

## 2024-01-02 ENCOUNTER — Encounter (HOSPITAL_COMMUNITY): Payer: Self-pay | Admitting: Internal Medicine

## 2024-01-02 ENCOUNTER — Ambulatory Visit: Payer: Self-pay | Admitting: Internal Medicine

## 2024-01-02 LAB — SURGICAL PATHOLOGY

## 2024-01-03 NOTE — Anesthesia Postprocedure Evaluation (Signed)
 Anesthesia Post Note  Patient: [name]  Procedure(s) Performed: COLONOSCOPY  Patient location during evaluation: Phase II Anesthesia Type: General Level of consciousness: awake Pain management: pain level controlled Vital Signs Assessment: post-procedure vital signs reviewed and stable Respiratory status: spontaneous breathing and respiratory function stable Cardiovascular status: blood pressure returned to baseline and stable Postop Assessment: no headache and no apparent nausea or vomiting Anesthetic complications: no Comments: Late entry   No notable events documented.   Last Vitals:  Vitals:   01/01/24 0801 01/01/24 0935  BP: (!) 133/57 (!) 151/46  Pulse:  61  Resp: 15 13  Temp: 36.6 C 36.6 C  SpO2: 100% 95%    Last Pain:  Vitals:   01/02/24 1515  TempSrc:   PainSc: 0-No pain                 Yvonna JINNY Bosworth

## 2024-01-23 DIAGNOSIS — R112 Nausea with vomiting, unspecified: Secondary | ICD-10-CM | POA: Diagnosis not present

## 2024-01-23 DIAGNOSIS — R140 Abdominal distension (gaseous): Secondary | ICD-10-CM | POA: Diagnosis not present

## 2024-01-23 DIAGNOSIS — R197 Diarrhea, unspecified: Secondary | ICD-10-CM | POA: Diagnosis not present

## 2024-01-23 DIAGNOSIS — R109 Unspecified abdominal pain: Secondary | ICD-10-CM | POA: Diagnosis not present

## 2024-03-09 ENCOUNTER — Other Ambulatory Visit (HOSPITAL_COMMUNITY): Payer: Self-pay | Admitting: Nurse Practitioner

## 2024-03-09 DIAGNOSIS — E782 Mixed hyperlipidemia: Secondary | ICD-10-CM

## 2024-03-09 DIAGNOSIS — E1169 Type 2 diabetes mellitus with other specified complication: Secondary | ICD-10-CM | POA: Diagnosis not present

## 2024-03-09 DIAGNOSIS — Z23 Encounter for immunization: Secondary | ICD-10-CM | POA: Diagnosis not present

## 2024-03-09 DIAGNOSIS — R42 Dizziness and giddiness: Secondary | ICD-10-CM | POA: Diagnosis not present

## 2024-03-09 DIAGNOSIS — E1165 Type 2 diabetes mellitus with hyperglycemia: Secondary | ICD-10-CM | POA: Diagnosis not present

## 2024-03-09 DIAGNOSIS — I10 Essential (primary) hypertension: Secondary | ICD-10-CM | POA: Diagnosis not present

## 2024-03-11 DIAGNOSIS — E119 Type 2 diabetes mellitus without complications: Secondary | ICD-10-CM | POA: Diagnosis not present

## 2024-04-24 ENCOUNTER — Ambulatory Visit (HOSPITAL_COMMUNITY)
Admission: RE | Admit: 2024-04-24 | Discharge: 2024-04-24 | Disposition: A | Discharge status: Home / Self Care | Payer: Self-pay | Source: Ambulatory Visit | Attending: Nurse Practitioner | Admitting: Nurse Practitioner

## 2024-04-24 DIAGNOSIS — R42 Dizziness and giddiness: Secondary | ICD-10-CM | POA: Diagnosis present

## 2024-04-24 DIAGNOSIS — E782 Mixed hyperlipidemia: Secondary | ICD-10-CM | POA: Insufficient documentation
